# Patient Record
Sex: Female | Born: 1980
Health system: Southern US, Community
[De-identification: ages and names within clinical notes are randomized; demographics above are authoritative.]

## PROBLEM LIST (undated history)

## (undated) DIAGNOSIS — R112 Nausea with vomiting, unspecified: Secondary | ICD-10-CM

## (undated) DIAGNOSIS — N393 Stress incontinence (female) (male): Secondary | ICD-10-CM

## (undated) DIAGNOSIS — Z789 Other specified health status: Secondary | ICD-10-CM

## (undated) DIAGNOSIS — Z87442 Personal history of urinary calculi: Secondary | ICD-10-CM

## (undated) DIAGNOSIS — Z9889 Other specified postprocedural states: Secondary | ICD-10-CM

## (undated) DIAGNOSIS — N812 Incomplete uterovaginal prolapse: Secondary | ICD-10-CM

## (undated) DIAGNOSIS — K649 Unspecified hemorrhoids: Secondary | ICD-10-CM

## (undated) DIAGNOSIS — D352 Benign neoplasm of pituitary gland: Secondary | ICD-10-CM

## (undated) HISTORY — DX: Benign neoplasm of pituitary gland: D35.2

## (undated) HISTORY — PX: PLACEMENT OF BREAST IMPLANTS: SHX6334

## (undated) HISTORY — DX: Other specified health status: Z78.9

---

## 2005-11-04 ENCOUNTER — Emergency Department (HOSPITAL_COMMUNITY): Admission: EM | Admit: 2005-11-04 | Discharge: 2005-11-04 | Payer: Self-pay | Admitting: Emergency Medicine

## 2008-08-15 ENCOUNTER — Emergency Department (HOSPITAL_COMMUNITY): Admission: EM | Admit: 2008-08-15 | Discharge: 2008-08-15 | Payer: Self-pay | Admitting: Emergency Medicine

## 2008-08-15 IMAGING — CR DG CHEST 2V
2 series · 2 of 2 positions shown · non-contrast
Comparison: None

CLINICAL DATA: Cough and chest congestion.  Anterior right chest
pain.

CHEST - 2 VIEW

[view not recorded (1 of 2)]
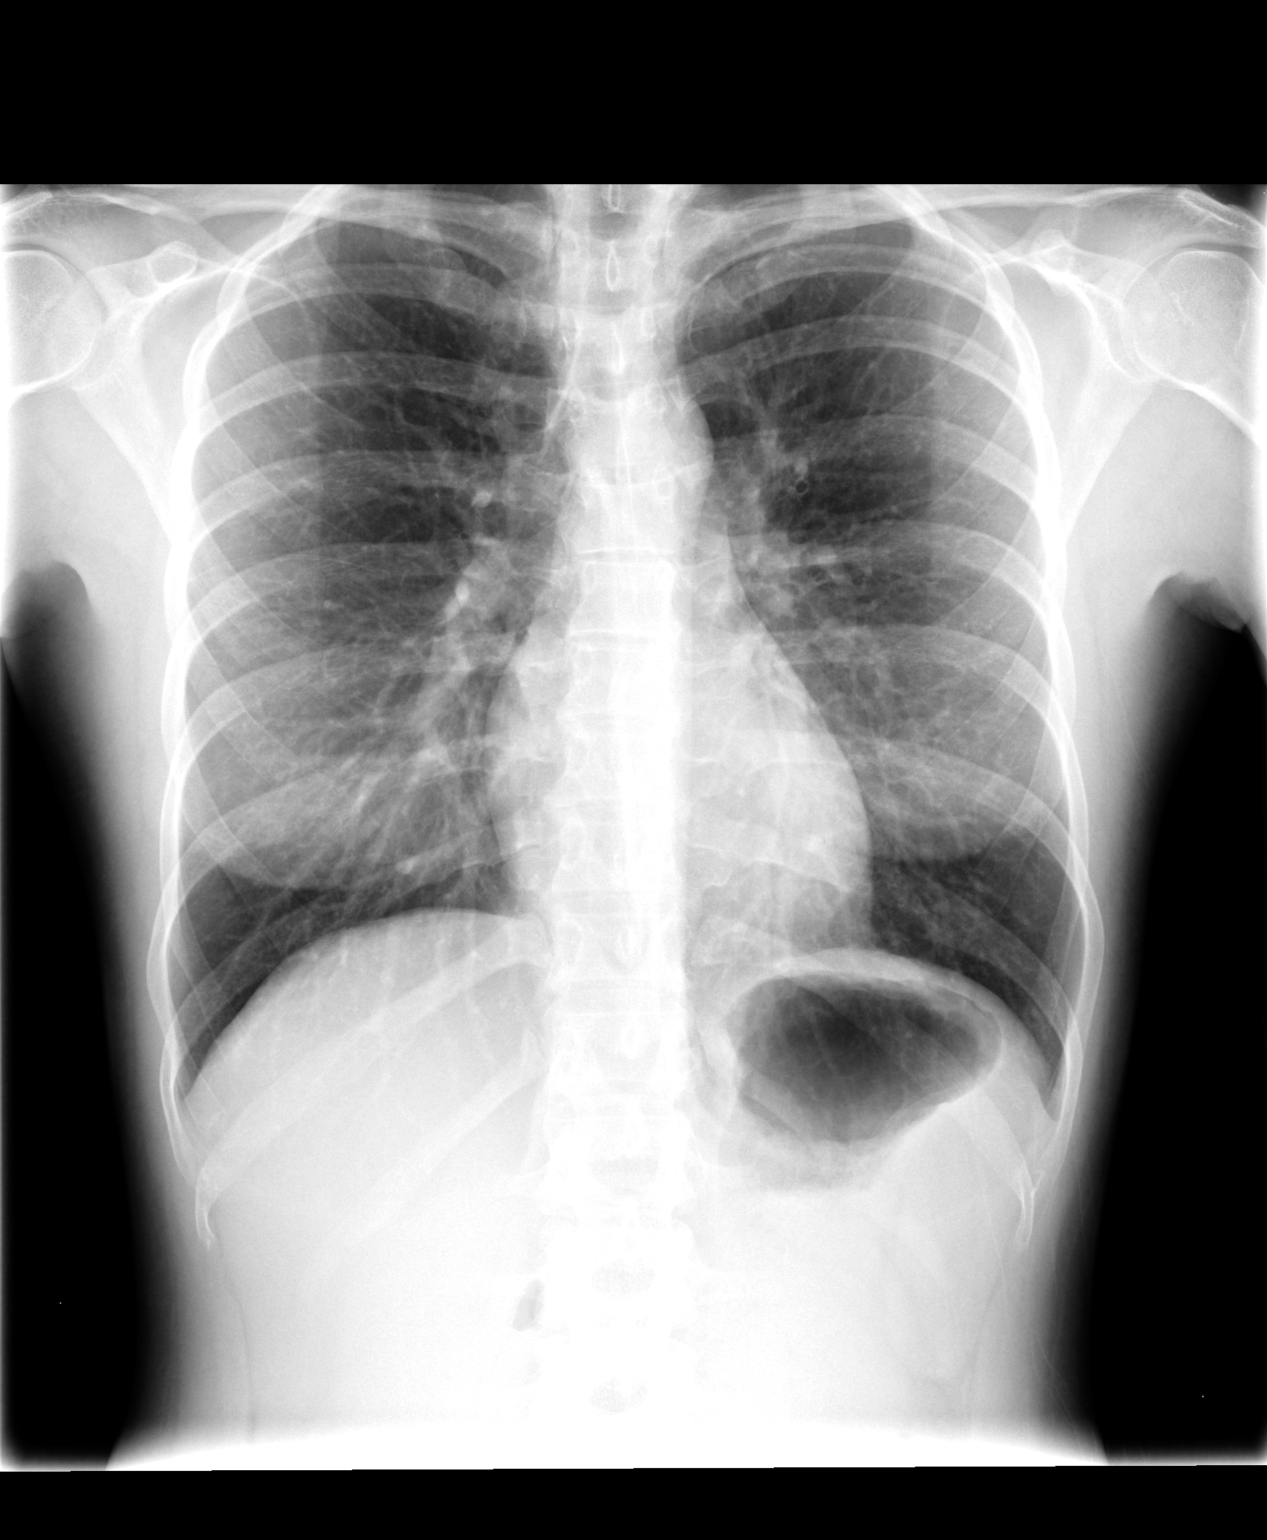

[view not recorded (2 of 2)]
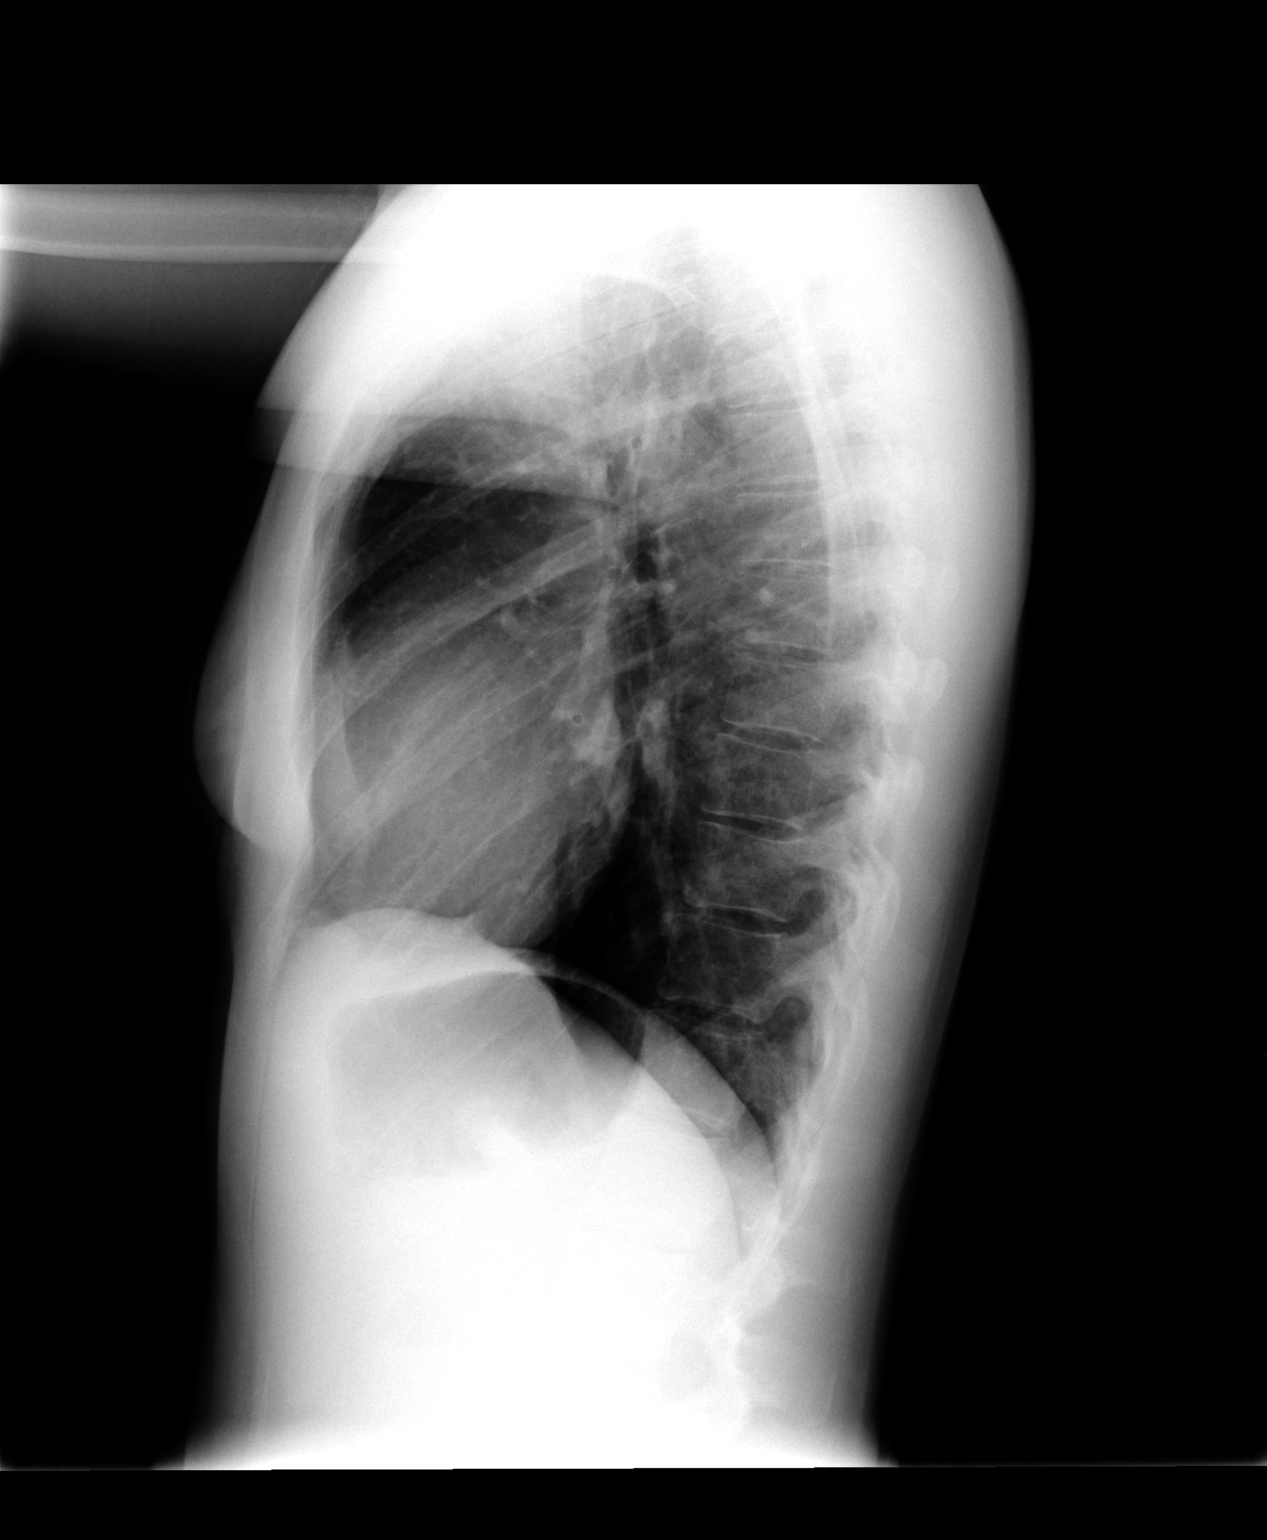

[2 of 2 positions shown; findings below may reference images not displayed]

FINDINGS: There is prominent peribronchial thickening consistent
with bronchitis.  The heart size and vascularity are normal.

There is a nondisplaced fracture of the anterior aspect of the
right sixth rib laterally.  No other bony abnormality.
IMPRESSION: 1.  Nondisplaced fracture of the anterior lateral aspect of the
right sixth rib.
2.  Extensive bronchitic changes.

## 2008-08-21 HISTORY — PX: AUGMENTATION MAMMAPLASTY: SUR837

## 2008-08-21 HISTORY — PX: BREAST ENHANCEMENT SURGERY: SHX7

## 2012-05-24 ENCOUNTER — Emergency Department (HOSPITAL_COMMUNITY)
Admission: EM | Admit: 2012-05-24 | Discharge: 2012-05-24 | Disposition: A | Discharge status: Home / Self Care | Payer: BC Managed Care – PPO | Attending: Emergency Medicine | Admitting: Emergency Medicine

## 2012-05-24 ENCOUNTER — Encounter (HOSPITAL_COMMUNITY): Payer: Self-pay | Admitting: Emergency Medicine

## 2012-05-24 DIAGNOSIS — M545 Low back pain, unspecified: Secondary | ICD-10-CM | POA: Insufficient documentation

## 2012-05-24 DIAGNOSIS — M549 Dorsalgia, unspecified: Secondary | ICD-10-CM
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
## 2012-05-24 NOTE — ED Provider Notes (Signed)
Medical screening examination/treatment/procedure(s) were performed by non-physician practitioner and as supervising physician I was immediately available for consultation/collaboration.  Flint Melter, MD 05/24/12 [phone]
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
## 2013-05-13 ENCOUNTER — Telehealth: Payer: Self-pay | Admitting: Family Medicine

## 2013-05-13 MED ORDER — VALACYCLOVIR HCL 1 G PO TABS: 1000.0000 mg | ORAL_TABLET | Freq: Three times a day (TID) | ORAL | Status: AC

## 2013-05-13 NOTE — Telephone Encounter (Signed)
Medication was sent to pharmacy. Patient was notified.  

## 2013-05-13 NOTE — Telephone Encounter (Signed)
Pt wants to know if she can get a refill on her Valtrex for the blister coming up on her finger? Please call into [facility]

## 2013-05-13 NOTE — Telephone Encounter (Signed)
NTC valtrex may be refilled times 3

## 2013-11-03 ENCOUNTER — Encounter: Payer: Self-pay | Admitting: Family Medicine

## 2013-11-03 ENCOUNTER — Ambulatory Visit (INDEPENDENT_AMBULATORY_CARE_PROVIDER_SITE_OTHER): Payer: BC Managed Care – PPO | Admitting: Family Medicine

## 2013-11-03 VITALS — BP 102/80 | Temp 98.9°F | Ht 61.0 in | Wt 116.4 lb

## 2013-11-03 DIAGNOSIS — L259 Unspecified contact dermatitis, unspecified cause: Secondary | ICD-10-CM

## 2013-11-03 MED ORDER — PREDNISONE 20 MG PO TABS: ORAL_TABLET | ORAL | Status: AC

## 2013-11-03 NOTE — Progress Notes (Signed)
   Subjective:    Patient ID: Briana Valdez, female    DOB: February 19, 1981, 33 y.o.   MRN: [id number]  Rash This is a new problem. The current episode started in the past 7 days. The problem has been gradually worsening since onset. The affected locations include the face, left ear and right ear. The rash is characterized by redness and itchiness. She was exposed to nothing. Past treatments include antihistamine. The treatment provided no relief.  Otalgia  There is pain in the right ear. This is a new problem. The current episode started today. The problem occurs constantly. The problem has been unchanged. There has been no fever. The pain is moderate. Associated symptoms include a rash. She has tried NSAIDs for the symptoms. The treatment provided mild relief.   PMH benign   Review of Systems  HENT: Positive for ear pain.   Skin: Positive for rash.       Objective:   Physical Exam Moderate contact dermatitis on the face underneath the cheeks around the ears no airway compromise lungs are clear hearts regular  Ears are normal there is no sign of ear infection.     Assessment & Plan:  Contact dermatitis is noted I recommend treatment with prednisone taper. If her reoccurrence of this occurs in the near future then referral to allergist.

## 2014-03-09 ENCOUNTER — Telehealth: Payer: Self-pay | Admitting: Family Medicine

## 2014-03-09 MED ORDER — FLUCONAZOLE 150 MG PO TABS: 150.0000 mg | ORAL_TABLET | Freq: Once | ORAL | Status: DC

## 2014-03-09 NOTE — Telephone Encounter (Signed)
[place] to [place] we will call [place] some  Diflucan x2 for her to a pharm [place] there?   [facility] Phone [phone]

## 2014-03-09 NOTE — Telephone Encounter (Signed)
Diflucan, 150 mg , 1 qwk times 2, #2 tablets , 1 refill

## 2014-03-09 NOTE — Telephone Encounter (Signed)
Patient notified

## 2015-06-07 ENCOUNTER — Telehealth: Payer: Self-pay | Admitting: Family Medicine

## 2015-06-07 MED ORDER — MALATHION 0.5 % EX LOTN: TOPICAL_LOTION | Freq: Once | CUTANEOUS | Status: DC

## 2015-06-07 NOTE — Telephone Encounter (Signed)
Pt needs that sent to [facility]

## 2015-06-07 NOTE — Telephone Encounter (Signed)
Topical Ovide, send in rx plz

## 2015-06-07 NOTE — Telephone Encounter (Signed)
Called patient and informed her per [place] was sent into pharmacy. Also advised patient to ask pharmacist for direct instructions on how to use medication. Patient verbalized understanding.

## 2015-06-07 NOTE — Telephone Encounter (Signed)
Pts son got lice over the weekend, she has tried Nix ultra to unable  To control it, has washed everything in the house, etc. She feels she  Needs to treat herself as well   [facility]

## 2016-08-21 DIAGNOSIS — D352 Benign neoplasm of pituitary gland: Secondary | ICD-10-CM

## 2016-08-21 HISTORY — DX: Benign neoplasm of pituitary gland: D35.2

## 2016-08-24 ENCOUNTER — Ambulatory Visit (INDEPENDENT_AMBULATORY_CARE_PROVIDER_SITE_OTHER): Payer: BLUE CROSS/BLUE SHIELD | Admitting: Family Medicine

## 2016-08-24 ENCOUNTER — Encounter: Payer: Self-pay | Admitting: Family Medicine

## 2016-08-24 VITALS — BP 100/70 | Temp 98.3°F | Ht 61.0 in | Wt 116.0 lb

## 2016-08-24 DIAGNOSIS — J019 Acute sinusitis, unspecified: Secondary | ICD-10-CM

## 2016-08-24 DIAGNOSIS — B349 Viral infection, unspecified: Secondary | ICD-10-CM

## 2016-08-24 DIAGNOSIS — B9689 Other specified bacterial agents as the cause of diseases classified elsewhere: Secondary | ICD-10-CM

## 2016-08-24 MED ORDER — AMOXICILLIN 500 MG PO TABS: 500.0000 mg | ORAL_TABLET | Freq: Three times a day (TID) | ORAL | 1 refills | Status: DC

## 2016-08-24 NOTE — Progress Notes (Signed)
   Subjective:    Patient ID: Briana Valdez, female    DOB: Mar 03, 1981, 36 y.o.   MRN: [id number]  Sinusitis  This is a new problem. The current episode started in the past 7 days. The problem is unchanged. There has been no fever. The pain is moderate. Associated symptoms include congestion, coughing, ear pain, headaches, sinus pressure, a sore throat and swollen glands. Pertinent negatives include no shortness of breath. Past treatments include spray decongestants and oral decongestants (ibuprofen, Tussin CF). The treatment provided no relief.   Patient states that she has no other concerns at this time.  PMH benign  Review of Systems  Constitutional: Negative for activity change and fever.  HENT: Positive for congestion, ear pain, rhinorrhea, sinus pressure and sore throat.   Eyes: Negative for discharge.  Respiratory: Positive for cough. Negative for shortness of breath and wheezing.   Cardiovascular: Negative for chest pain.  Neurological: Positive for headaches.       Objective:   Physical Exam  Constitutional: She appears well-developed.  HENT:  Head: Normocephalic.  Nose: Nose normal.  Mouth/Throat: Oropharynx is clear and moist. No oropharyngeal exudate.  Neck: Neck supple.  Cardiovascular: Normal rate and normal heart sounds.   No murmur heard. Pulmonary/Chest: Effort normal and breath sounds normal. She has no wheezes.  Lymphadenopathy:    She has no cervical adenopathy.  Skin: Skin is warm and dry.  Nursing note and vitals reviewed.  Minimal adenopathy is noted more on the right side in the left side of the anterior portion of the neck       Assessment & Plan:  Viral syndrome Secondary rhinosinusitis Antibiotic prescribed warning signs discussed follow-up if progressive troubles

## 2017-07-16 ENCOUNTER — Telehealth: Payer: Self-pay | Admitting: Family Medicine

## 2017-07-16 DIAGNOSIS — Z13220 Encounter for screening for lipoid disorders: Secondary | ICD-10-CM

## 2017-07-16 DIAGNOSIS — Z0000 Encounter for general adult medical examination without abnormal findings: Secondary | ICD-10-CM

## 2017-07-16 DIAGNOSIS — Z114 Encounter for screening for human immunodeficiency virus [HIV]: Secondary | ICD-10-CM

## 2017-07-16 NOTE — Telephone Encounter (Signed)
Patient has an appt for a physical on January 9th and would like to have labwork done in advance.  No hurry but would like to go ahead and get the order put in.

## 2017-07-16 NOTE — Telephone Encounter (Signed)
No bloodwork in epic

## 2017-07-16 NOTE — Telephone Encounter (Signed)
Lipid, liver, metabolic 7, CBC-I also recommend HIV antibody you please let the patient know that this is standard as a one-time test for screening per CDC guidelines

## 2017-07-16 NOTE — Telephone Encounter (Signed)
Discussed with pt. Pt is ok with HIV being orders. Pt notified where to do labs and to be fasting. Pt verbalized understanding. Orders put in.

## 2017-08-15 ENCOUNTER — Ambulatory Visit: Payer: BLUE CROSS/BLUE SHIELD | Admitting: Family Medicine

## 2017-08-29 ENCOUNTER — Encounter: Payer: BLUE CROSS/BLUE SHIELD | Admitting: Family Medicine

## 2017-12-12 ENCOUNTER — Encounter: Payer: Self-pay | Admitting: Family Medicine

## 2017-12-17 ENCOUNTER — Ambulatory Visit (INDEPENDENT_AMBULATORY_CARE_PROVIDER_SITE_OTHER): Payer: BLUE CROSS/BLUE SHIELD | Admitting: Family Medicine

## 2017-12-17 ENCOUNTER — Encounter: Payer: Self-pay | Admitting: Family Medicine

## 2017-12-17 VITALS — BP 118/82 | Ht 61.0 in | Wt 121.0 lb

## 2017-12-17 DIAGNOSIS — Z114 Encounter for screening for human immunodeficiency virus [HIV]: Secondary | ICD-10-CM | POA: Diagnosis not present

## 2017-12-17 DIAGNOSIS — Z13220 Encounter for screening for lipoid disorders: Secondary | ICD-10-CM | POA: Diagnosis not present

## 2017-12-17 DIAGNOSIS — Z0000 Encounter for general adult medical examination without abnormal findings: Secondary | ICD-10-CM

## 2017-12-17 NOTE — Progress Notes (Signed)
   Subjective:    Patient ID: Briana Valdez, female    DOB: Sep 04, 1980, 37 y.o.   MRN: [id number]  HPI The patient comes in today for a wellness visit.    A review of their health history was completed.  A review of medications was also completed.  Any needed refills; No  Eating habits: Good  Falls/  MVA accidents in past few months: No  Regular exercise: Not like she should,but stays busy.  Specialist pt sees on regular basis: No  Preventative health issues were discussed.   Additional concerns: No  She tries eat healthy Does not smoke or drink  Review of Systems  Constitutional: Negative for activity change, appetite change and fatigue.  HENT: Negative for congestion and rhinorrhea.   Eyes: Negative for discharge.  Respiratory: Negative for cough, chest tightness and wheezing.   Cardiovascular: Negative for chest pain.  Gastrointestinal: Negative for abdominal pain, blood in stool and vomiting.  Endocrine: Negative for polyphagia.  Genitourinary: Negative for difficulty urinating and frequency.  Musculoskeletal: Negative for neck pain.  Skin: Negative for color change.  Allergic/Immunologic: Negative for environmental allergies and food allergies.  Neurological: Negative for weakness and headaches.  Psychiatric/Behavioral: Negative for agitation and behavioral problems.       Objective:   Physical Exam  Constitutional: She is oriented to person, place, and time. She appears well-developed and well-nourished.  HENT:  Head: Normocephalic.  Right Ear: External ear normal.  Left Ear: External ear normal.  Eyes: Pupils are equal, round, and reactive to light.  Neck: Normal range of motion. No thyromegaly present.  Cardiovascular: Normal rate, regular rhythm, normal heart sounds and intact distal pulses.  No murmur heard. Pulmonary/Chest: Effort normal and breath sounds normal. No respiratory distress. She has no wheezes.  Abdominal: Soft. Bowel sounds are  normal. She exhibits no distension and no mass. There is no tenderness.  Musculoskeletal: Normal range of motion. She exhibits no edema or tenderness.  Lymphadenopathy:    She has no cervical adenopathy.  Neurological: She is alert and oriented to person, place, and time. She exhibits normal muscle tone.  Skin: Skin is warm and dry.  Psychiatric: She has a normal mood and affect. Her behavior is normal.   Patient does not smoke or drink Denies being depressed No falls or injury       Assessment & Plan:  Adult wellness-complete.wellness physical was conducted today. Importance of diet and exercise were discussed in detail.  In addition to this a discussion regarding safety was also covered. We also reviewed over immunizations and gave recommendations regarding current immunization needed for age.  In addition to this additional areas were also touched on including: Preventative health exams needed: Colonoscopy not indicated currently  Patient was advised yearly wellness exam Patient will do m female gynecologic wellness for her gynecologic issues on a regular basis with her gynecologist   basic lab work ordered

## 2017-12-18 LAB — CBC WITH DIFFERENTIAL/PLATELET
BASOS: 0 %
Basophils Absolute: 0 10*3/uL (ref 0.0–0.2)
EOS (ABSOLUTE): 0.1 10*3/uL (ref 0.0–0.4)
EOS: 1 %
HEMATOCRIT: 44.1 % (ref 34.0–46.6)
HEMOGLOBIN: 14.8 g/dL (ref 11.1–15.9)
IMMATURE GRANS (ABS): 0 10*3/uL (ref 0.0–0.1)
IMMATURE GRANULOCYTES: 1 %
LYMPHS: 24 %
Lymphocytes Absolute: 1.8 10*3/uL (ref 0.7–3.1)
MCH: 31.8 pg (ref 26.6–33.0)
MCHC: 33.6 g/dL (ref 31.5–35.7)
MCV: 95 fL (ref 79–97)
MONOCYTES: 10 %
Monocytes Absolute: 0.8 10*3/uL (ref 0.1–0.9)
NEUTROS PCT: 64 %
Neutrophils Absolute: 4.8 10*3/uL (ref 1.4–7.0)
PLATELETS: 267 10*3/uL (ref 150–379)
RBC: 4.65 x10E6/uL (ref 3.77–5.28)
RDW: 13 % (ref 12.3–15.4)
WBC: 7.4 10*3/uL (ref 3.4–10.8)

## 2017-12-18 LAB — LIPID PANEL
Chol/HDL Ratio: 2 ratio (ref 0.0–4.4)
Cholesterol, Total: 148 mg/dL (ref 100–199)
HDL: 75 mg/dL (ref >39)
LDL CALC: 64 mg/dL (ref 0–99)
Triglycerides: 43 mg/dL (ref 0–149)
VLDL CHOLESTEROL CAL: 9 mg/dL (ref 5–40)

## 2017-12-18 LAB — HEPATIC FUNCTION PANEL
ALBUMIN: 4.5 g/dL (ref 3.5–5.5)
ALT: 12 IU/L (ref 0–32)
AST: 17 IU/L (ref 0–40)
Alkaline Phosphatase: 43 IU/L (ref 39–117)
BILIRUBIN TOTAL: 0.7 mg/dL (ref 0.0–1.2)
Bilirubin, Direct: 0.2 mg/dL (ref 0.00–0.40)
TOTAL PROTEIN: 7.3 g/dL (ref 6.0–8.5)

## 2017-12-18 LAB — HIV ANTIBODY (ROUTINE TESTING W REFLEX): HIV Screen 4th Generation wRfx: NONREACTIVE

## 2017-12-18 LAB — BASIC METABOLIC PANEL
BUN/Creatinine Ratio: 19 (ref 9–23)
BUN: 12 mg/dL (ref 6–20)
CALCIUM: 9.4 mg/dL (ref 8.7–10.2)
CO2: 24 mmol/L (ref 20–29)
CREATININE: 0.64 mg/dL (ref 0.57–1.00)
Chloride: 102 mmol/L (ref 96–106)
GFR calc non Af Amer: 114 mL/min/{1.73_m2} (ref >59)
GFR, EST AFRICAN AMERICAN: 131 mL/min/{1.73_m2} (ref >59)
Glucose: 71 mg/dL (ref 65–99)
Potassium: 4.4 mmol/L (ref 3.5–5.2)
Sodium: 139 mmol/L (ref 134–144)

## 2018-01-23 ENCOUNTER — Telehealth: Payer: Self-pay | Admitting: Family Medicine

## 2018-01-23 ENCOUNTER — Telehealth: Payer: Self-pay | Admitting: *Deleted

## 2018-01-23 ENCOUNTER — Other Ambulatory Visit: Payer: Self-pay | Admitting: Family Medicine

## 2018-01-23 MED ORDER — MOMETASONE FUROATE 0.1 % EX CREA: TOPICAL_CREAM | CUTANEOUS | 1 refills | Status: DC

## 2018-01-23 NOTE — Telephone Encounter (Signed)
Pt husband called requesting prednisone. Husband stated that his wife has a patch of poison oak on her leg. Could we call in something for her also?

## 2018-01-23 NOTE — Telephone Encounter (Signed)
As for the wife-this patient-there are choices. 1 choices a topical steroid although it could take a while for this to clear things up the other option is prednisone taper But if she would need to be on this over a standard 9-day treatment-please talk with family find out what they would like to do then we can send this in

## 2018-01-23 NOTE — Telephone Encounter (Signed)
Left message to return call 

## 2018-01-23 NOTE — Telephone Encounter (Signed)
Elocon cream 0.1% apply twice daily, will typically have to use this for 7 to 10 days.  Apply thin amount.  30 g, 1 refill

## 2018-01-23 NOTE — Telephone Encounter (Signed)
Patient returned call and new message was taken - see other patient call 01/23/18

## 2018-01-23 NOTE — Telephone Encounter (Signed)
Med sent into pharmacy.

## 2018-01-23 NOTE — Telephone Encounter (Signed)
Patient returned call and would like the cream sent in for poison oak

## 2018-01-23 NOTE — Telephone Encounter (Signed)
Patient called requesting something to be called in for poison ivy, patient states it is only on her knees and ankles. Please advise   [place]

## 2018-01-24 ENCOUNTER — Encounter: Payer: Self-pay | Admitting: Family Medicine

## 2018-01-24 ENCOUNTER — Ambulatory Visit: Payer: BLUE CROSS/BLUE SHIELD | Admitting: Family Medicine

## 2018-01-24 VITALS — BP 124/82 | Temp 98.9°F | Ht 61.0 in | Wt 120.0 lb

## 2018-01-24 DIAGNOSIS — R21 Rash and other nonspecific skin eruption: Secondary | ICD-10-CM

## 2018-01-24 MED ORDER — MUPIROCIN 2 % EX OINT: 1.0000 "application " | TOPICAL_OINTMENT | Freq: Two times a day (BID) | CUTANEOUS | 0 refills | Status: DC

## 2018-01-24 MED ORDER — PREDNISONE 20 MG PO TABS: ORAL_TABLET | ORAL | 0 refills | Status: DC

## 2018-01-24 NOTE — Progress Notes (Signed)
   Subjective:    Patient ID: Briana Valdez, female    DOB: 09-11-1980, 37 y.o.   MRN: [id number]  Rash  This is a new problem. Episode onset: 4 -5 days ago. Location: legs. She was exposed to plant contact. Treatments tried: elocon cream.     Patient has no major history of poison oak or poison ivy allergy.  At outside a lot this past week.  Developed a rash on her right ankle.  Started weeping.  Developed a very large blister which was alarming to the patient.  No fever no chills no headache   Review of Systems  Skin: Positive for rash.       Objective:   Physical Exam  Alert vitals stable, NAD. Blood pressure good on repeat. HEENT normal. Lungs clear. Heart regular rate and rhythm. Impressive right anterior ankle patch with diffuse weeping and one very large blister which was prepped draped and incised and drained.      Assessment & Plan:  Impression impressive ruse dermatitis local measures discussed.  Prednisone taper.  Bactroban twice daily to ulcerated area.  Expect slow resolution rationale discussed.

## 2019-05-17 DIAGNOSIS — R350 Frequency of micturition: Secondary | ICD-10-CM | POA: Diagnosis not present

## 2019-05-17 DIAGNOSIS — B373 Candidiasis of vulva and vagina: Secondary | ICD-10-CM | POA: Diagnosis not present

## 2019-05-17 DIAGNOSIS — R300 Dysuria: Secondary | ICD-10-CM | POA: Diagnosis not present

## 2019-06-04 ENCOUNTER — Telehealth: Payer: Self-pay | Admitting: Family Medicine

## 2019-06-04 DIAGNOSIS — Z13220 Encounter for screening for lipoid disorders: Secondary | ICD-10-CM

## 2019-06-04 DIAGNOSIS — Z0000 Encounter for general adult medical examination without abnormal findings: Secondary | ICD-10-CM

## 2019-06-04 DIAGNOSIS — Z131 Encounter for screening for diabetes mellitus: Secondary | ICD-10-CM

## 2019-06-04 NOTE — Telephone Encounter (Signed)
Pt has physical here 06/16/2019 - needs labs ordered  Pt aware we will only call if there's an issue

## 2019-06-04 NOTE — Telephone Encounter (Signed)
Last labs completed on 12/17/17 HIV,CBC, BMET,HEPATIC and LIPID. Please advise. Thank you

## 2019-06-04 NOTE — Telephone Encounter (Signed)
Her lab work last year looked very good I would recommend just a lipid and glucose

## 2019-06-04 NOTE — Telephone Encounter (Signed)
Labs placed and mailed to patient  

## 2019-06-09 ENCOUNTER — Encounter: Payer: BLUE CROSS/BLUE SHIELD | Admitting: Family Medicine

## 2019-06-13 DIAGNOSIS — L659 Nonscarring hair loss, unspecified: Secondary | ICD-10-CM | POA: Diagnosis not present

## 2019-06-13 DIAGNOSIS — Z0000 Encounter for general adult medical examination without abnormal findings: Secondary | ICD-10-CM | POA: Diagnosis not present

## 2019-06-13 DIAGNOSIS — Z131 Encounter for screening for diabetes mellitus: Secondary | ICD-10-CM | POA: Diagnosis not present

## 2019-06-13 DIAGNOSIS — Z13220 Encounter for screening for lipoid disorders: Secondary | ICD-10-CM | POA: Diagnosis not present

## 2019-06-14 LAB — LIPID PANEL
Chol/HDL Ratio: 2.2 ratio (ref 0.0–4.4)
Cholesterol, Total: 156 mg/dL (ref 100–199)
HDL: 72 mg/dL (ref >39)
LDL Chol Calc (NIH): 74 mg/dL (ref 0–99)
Triglycerides: 44 mg/dL (ref 0–149)
VLDL Cholesterol Cal: 10 mg/dL (ref 5–40)

## 2019-06-14 LAB — GLUCOSE, RANDOM: Glucose: 78 mg/dL (ref 65–99)

## 2019-06-16 ENCOUNTER — Encounter: Payer: Self-pay | Admitting: Family Medicine

## 2019-06-16 ENCOUNTER — Other Ambulatory Visit: Payer: Self-pay

## 2019-06-16 ENCOUNTER — Ambulatory Visit (INDEPENDENT_AMBULATORY_CARE_PROVIDER_SITE_OTHER): Payer: BC Managed Care – PPO | Admitting: Family Medicine

## 2019-06-16 VITALS — BP 116/74 | Temp 98.4°F | Ht 61.35 in | Wt 112.8 lb

## 2019-06-16 DIAGNOSIS — Z0000 Encounter for general adult medical examination without abnormal findings: Secondary | ICD-10-CM

## 2019-06-16 NOTE — Progress Notes (Signed)
Subjective:    Patient ID: Briana Valdez, female    DOB: Jan 31, 1981, 38 y.o.   MRN: [id number]  HPI The patient comes in today for a wellness visit. This patient does try to keep her self healthy by eating on a regular basis stays physically active.  Denies any setbacks recently Under a lot of stress between taking care of her family as well as Covid restrictions and also she is a hairdresser She does get some soreness in the hands but nothing severe no true arthralgias   A review of their health history was completed.  A review of medications was also completed.  Any needed refills; not taking any meds  Eating habits: health conscious  Falls/  MVA accidents in past few months: none  Regular exercise: not really, treadmill every once and awhile  Specialist pt sees on regular basis: none, gyn for pap.   Preventative health issues were discussed.   Additional concerns: none  Pt declines flu vaccine.     Review of Systems  Constitutional: Negative for activity change, appetite change and fatigue.  HENT: Negative for congestion and rhinorrhea.   Eyes: Negative for discharge.  Respiratory: Negative for cough, chest tightness and wheezing.   Cardiovascular: Negative for chest pain.  Gastrointestinal: Negative for abdominal pain, blood in stool and vomiting.  Endocrine: Negative for polyphagia.  Genitourinary: Negative for difficulty urinating and frequency.  Musculoskeletal: Negative for neck pain.  Skin: Negative for color change.  Allergic/Immunologic: Negative for environmental allergies and food allergies.  Neurological: Negative for weakness and headaches.  Psychiatric/Behavioral: Negative for agitation and behavioral problems.       Objective:   Physical Exam Constitutional:      Appearance: She is well-developed.  HENT:     Head: Normocephalic.     Right Ear: External ear normal.     Left Ear: External ear normal.  Eyes:     Pupils: Pupils are equal,  round, and reactive to light.  Neck:     Musculoskeletal: Normal range of motion.     Thyroid: No thyromegaly.  Cardiovascular:     Rate and Rhythm: Normal rate and regular rhythm.     Heart sounds: Normal heart sounds. No murmur.  Pulmonary:     Effort: Pulmonary effort is normal. No respiratory distress.     Breath sounds: Normal breath sounds. No wheezing.  Abdominal:     General: Bowel sounds are normal. There is no distension.     Palpations: Abdomen is soft. There is no mass.     Tenderness: There is no abdominal tenderness.  Musculoskeletal: Normal range of motion.        General: No tenderness.  Lymphadenopathy:     Cervical: No cervical adenopathy.  Skin:    General: Skin is warm and dry.  Neurological:     Mental Status: She is alert and oriented to person, place, and time.     Motor: No abnormal muscle tone.  Psychiatric:        Behavior: Behavior normal.           Assessment & Plan:  Safety dietary measures recommended Flu shot recommended but patient defers Patient is to follow-up with gynecology on a regular basis Also patient should get mammograms on a regular basis.  She states she will get this through gynecology  Adult wellness-complete.wellness physical was conducted today. Importance of diet and exercise were discussed in detail.  In addition to this a discussion regarding safety was also  covered. We also reviewed over immunizations and gave recommendations regarding current immunization needed for age.  In addition to this additional areas were also touched on including: Preventative health exams needed:  Colonoscopy not indicated currently  Patient was advised yearly wellness exam

## 2019-06-17 ENCOUNTER — Telehealth: Payer: Self-pay | Admitting: *Deleted

## 2019-06-17 NOTE — Telephone Encounter (Signed)
Called [facility] and had test to labs ths and free t4 dx- alopecia L65.9 per dr Nicki Reaper. Lab will pull sample and let us know if they can run or not. Await fax from lab

## 2019-06-24 LAB — SPECIMEN STATUS REPORT

## 2019-06-28 LAB — TSH: TSH: 0.573 u[IU]/mL (ref 0.450–4.500)

## 2019-06-28 LAB — SPECIMEN STATUS REPORT

## 2019-06-28 LAB — T4, FREE: Free T4: 1.19 ng/dL (ref 0.82–1.77)

## 2019-07-01 ENCOUNTER — Ambulatory Visit (INDEPENDENT_AMBULATORY_CARE_PROVIDER_SITE_OTHER): Payer: BC Managed Care – PPO | Admitting: Family Medicine

## 2019-07-01 ENCOUNTER — Other Ambulatory Visit: Payer: Self-pay

## 2019-07-01 DIAGNOSIS — J019 Acute sinusitis, unspecified: Secondary | ICD-10-CM | POA: Diagnosis not present

## 2019-07-01 MED ORDER — AMOXICILLIN 500 MG PO TABS: 500.0000 mg | ORAL_TABLET | Freq: Three times a day (TID) | ORAL | 0 refills | Status: DC

## 2019-07-01 NOTE — Progress Notes (Signed)
   Subjective:    Patient ID: Briana Valdez, female    DOB: [birth year], 38 y.o.   MRN: [id number]  Sinusitis This is a new problem. The current episode started 1 to 4 weeks ago. Associated symptoms include congestion and coughing. Pertinent negatives include no ear pain or shortness of breath. (Sinus drainage, drainage down throat at night, spent a lot of time outside on Saturday, on Sunday pt has more drainage with ear pain. Taking Sudafed and that is kind of helping. Sinus pressure has eased up some. No fever no cough. ) Treatments tried: Sudafed. The treatment provided mild relief.  Patient has had about a week of illness with head congestion drainage is worse over the past few days no fever chills sinus pressure pain discomfort teeth are aching otherwise feels fine  In addition to this some allergy symptoms over the past few weeks using sinus decongestant tablets but no antihistamines Virtual Visit via Video Note  I connected with Briana Valdez on 07/01/19 at 11:00 AM EST by a video enabled telemedicine application and verified that I am speaking with the correct person using two identifiers.  Location: Patient: home Provider: office   I discussed the limitations of evaluation and management by telemedicine and the availability of in person appointments. The patient expressed understanding and agreed to proceed.  History of Present Illness:    Observations/Objective:   Assessment and Plan:   Follow Up Instructions:    I discussed the assessment and treatment plan with the patient. The patient was provided an opportunity to ask questions and all were answered. The patient agreed with the plan and demonstrated an understanding of the instructions.   The patient was advised to call back or seek an in-person evaluation if the symptoms worsen or if the condition fails to improve as anticipated.  I provided 15 minutes of non-face-to-face time during this encounter.   Briana Males, LPN    Review of Systems  Constitutional: Negative for activity change and fever.  HENT: Positive for congestion and rhinorrhea. Negative for ear pain.   Eyes: Negative for discharge.  Respiratory: Positive for cough. Negative for shortness of breath and wheezing.   Cardiovascular: Negative for chest pain.       Objective:   Physical Exam Patient had virtual visit Appears to be in no distress Atraumatic Neuro able to relate and oriented No apparent resp distress Color normal        Assessment & Plan:  Based on his symptomatology I would recommend going ahead with antibiotics warning signs were discussed follow-up if progressive troubles or worse use allergy medicine decongestant medicine

## 2020-01-14 DIAGNOSIS — Z01419 Encounter for gynecological examination (general) (routine) without abnormal findings: Secondary | ICD-10-CM | POA: Diagnosis not present

## 2020-01-14 DIAGNOSIS — Z1231 Encounter for screening mammogram for malignant neoplasm of breast: Secondary | ICD-10-CM | POA: Diagnosis not present

## 2020-01-14 DIAGNOSIS — Z124 Encounter for screening for malignant neoplasm of cervix: Secondary | ICD-10-CM | POA: Diagnosis not present

## 2020-03-15 DIAGNOSIS — Z3202 Encounter for pregnancy test, result negative: Secondary | ICD-10-CM | POA: Diagnosis not present

## 2020-03-15 DIAGNOSIS — Z6821 Body mass index (BMI) 21.0-21.9, adult: Secondary | ICD-10-CM | POA: Diagnosis not present

## 2020-03-15 DIAGNOSIS — Z30433 Encounter for removal and reinsertion of intrauterine contraceptive device: Secondary | ICD-10-CM | POA: Diagnosis not present

## 2020-04-07 ENCOUNTER — Telehealth: Payer: Self-pay | Admitting: Family Medicine

## 2020-04-07 NOTE — Telephone Encounter (Signed)
Pt contacted and verbalized understanding.  

## 2020-04-07 NOTE — Telephone Encounter (Signed)
Pt states she has a Hemorid since yesterday. She has tried preporation H, ice packs, cooling pads and sitz bath. She states it is not as bad as yesterday but would like to know what else she can do.   [place].

## 2020-04-07 NOTE — Telephone Encounter (Signed)
So the approach for something like this is continue the Preparation H, use stool softeners to keep bowel movements soft, warm compresses as needed, warm sitz bath's as needed, these type of things can take anywhere from a few days to up to a couple weeks to get better Occasionally can get bad enough to refer to a general surgeon

## 2020-04-11 ENCOUNTER — Other Ambulatory Visit: Payer: Self-pay

## 2020-04-11 ENCOUNTER — Ambulatory Visit
Admission: EM | Admit: 2020-04-11 | Discharge: 2020-04-11 | Disposition: A | Discharge status: Home / Self Care | Payer: BC Managed Care – PPO | Attending: Family Medicine | Admitting: Family Medicine

## 2020-04-11 DIAGNOSIS — N3000 Acute cystitis without hematuria: Secondary | ICD-10-CM

## 2020-04-11 DIAGNOSIS — R3915 Urgency of urination: Secondary | ICD-10-CM | POA: Diagnosis not present

## 2020-04-11 DIAGNOSIS — R350 Frequency of micturition: Secondary | ICD-10-CM

## 2020-04-11 DIAGNOSIS — K648 Other hemorrhoids: Secondary | ICD-10-CM

## 2020-04-11 LAB — POCT URINALYSIS DIP (MANUAL ENTRY)
Bilirubin, UA: NEGATIVE
Glucose, UA: NEGATIVE mg/dL
Ketones, POC UA: NEGATIVE mg/dL
Nitrite, UA: POSITIVE — AB
Protein Ur, POC: 30 mg/dL — AB
Spec Grav, UA: 1.02 (ref 1.010–1.025)
Urobilinogen, UA: 0.2 E.U./dL
pH, UA: 7 (ref 5.0–8.0)

## 2020-04-11 MED ORDER — DIBUCAINE (PERIANAL) 1 % EX OINT: 1.0000 "application " | TOPICAL_OINTMENT | CUTANEOUS | 0 refills | Status: DC | PRN

## 2020-04-11 MED ORDER — NITROFURANTOIN MONOHYD MACRO 100 MG PO CAPS: 100.0000 mg | ORAL_CAPSULE | Freq: Two times a day (BID) | ORAL | 0 refills | Status: DC

## 2020-04-11 NOTE — Discharge Instructions (Addendum)
You may have a urinary tract infection.   We are going to culture your urine and will call you as soon as we have the results.   Drink plenty of water, 8-10 glasses per day.   I have sent in [place] for you to take twice a day for 5 days  I have also sent in dibucaine ointment to use for hemorrhoids  Continue ice, sitz baths, witch hazel, may add hydrocortisone cream for inflammation  You may take AZO over the counter for painful urination.  Follow up with your primary care provider as needed.   Go to the Emergency Department if you experience severe pain, shortness of breath, high fever, or other concerns.

## 2020-04-11 NOTE — ED Provider Notes (Signed)
[facility]   CC: UTI  SUBJECTIVE:  Briana Valdez is a 39 y.o. female who complains of urinary frequency, urgency and dysuria for the past 3 days. Patient denies a precipitating event, recent sexual encounter, excessive caffeine intake. Localizes the pain to the lower abdomen. Pain is intermittent and describes it as burning. Has tried OTC medications without relief. Symptoms are made worse with urination. Admits to similar symptoms in the past. Denies fever, chills, nausea, vomiting, flank pain, abnormal vaginal discharge or bleeding, hematuria.    Also reports that she has been struggling with hemorrhoids since her last child was born x 13 years ago. Reports that she has them now, worse than ever. Has been using tucks pads, preparation H, sitz baths with little relief. Pain is made worse with sitting.   LMP: No LMP recorded. (Menstrual status: IUD).  ROS: As in HPI.  All other pertinent ROS negative.     History reviewed. No pertinent past medical history. History reviewed. No pertinent surgical history. No Known Allergies No current facility-administered medications on file prior to encounter.   Current Outpatient Medications on File Prior to Encounter  Medication Sig Dispense Refill   amoxicillin (AMOXIL) 500 MG tablet Take 1 tablet (500 mg total) by mouth 3 (three) times daily. 30 tablet 0   loratadine (CLARITIN) 10 MG tablet Take 10 mg by mouth daily.     ranitidine (ZANTAC) 150 MG tablet Take 150 mg by mouth 2 (two) times daily.     Social History   Socioeconomic History   Marital status: Married    Spouse name: Not on file   Number of children: Not on file   Years of education: Not on file   Highest education level: Not on file  Occupational History   Not on file  Tobacco Use   Smoking status: Never Smoker   Smokeless tobacco: Never Used  Substance and Sexual Activity   Alcohol use: No   Drug use: No   Sexual activity: Yes    Birth  control/protection: Implant  Other Topics Concern   Not on file  Social History Narrative   Not on file   Social Determinants of Health   Financial Resource Strain:    Difficulty of Paying Living Expenses: Not on file  Food Insecurity:    Worried About [place] in the Last Year: Not on file   Ran Out of Food in the Last Year: Not on file  Transportation Needs:    Lack of Transportation (Medical): Not on file   Lack of Transportation (Non-Medical): Not on file  Physical Activity:    Days of Exercise per Week: Not on file   Minutes of Exercise per Session: Not on file  Stress:    Feeling of Stress : Not on file  Social Connections:    Frequency of Communication with Friends and Family: Not on file   Frequency of Social Gatherings with Friends and Family: Not on file   Attends Religious Services: Not on file   Active Member of Clubs or Organizations: Not on file   Attends Archivist Meetings: Not on file   Marital Status: Not on file  Intimate Partner Violence:    Fear of Current or Ex-Partner: Not on file   Emotionally Abused: Not on file   Physically Abused: Not on file   Sexually Abused: Not on file   Family History  Problem Relation Age of Onset   Cancer Mother  OBJECTIVE:  Vitals:   04/11/20 0850  BP: 119/81  Pulse: (!) 108  Resp: 20  Temp: 98.6 F (37 C)  SpO2: 97%   General appearance: AOx3 in no acute distress HEENT: NCAT. Oropharynx clear.  Lungs: clear to auscultation bilaterally without adventitious breath sounds Heart: regular rate and rhythm. Radial pulses 2+ symmetrical bilaterally Abdomen: soft; non-distended; suprapubic tenderness; bowel sounds present; no guarding or rebound tenderness; declined hemorrhoid exam Back: no CVA tenderness Extremities: no edema; symmetrical with no gross deformities Skin: warm and dry Neurologic: Ambulates from chair to exam table without difficulty Psychological: alert  and cooperative; normal mood and affect  Labs Reviewed  POCT URINALYSIS DIP (MANUAL ENTRY) - Abnormal; Notable for the following components:      Result Value   Clarity, UA hazy (*)    Blood, UA moderate (*)    Protein Ur, POC =30 (*)    Nitrite, UA Positive (*)    Leukocytes, UA Large (3+) (*)    All other components within normal limits  URINE CULTURE    ASSESSMENT & PLAN:  1. Acute cystitis without hematuria   2. Other hemorrhoids   3. Urinary urgency   4. Urinary frequency     Meds ordered this encounter  Medications   nitrofurantoin, macrocrystal-monohydrate, (MACROBID) 100 MG capsule    Sig: Take 1 capsule (100 mg total) by mouth 2 (two) times daily.    Dispense:  10 capsule    Refill:  0    Order Specific Question:   Supervising Provider    Answer:   Chase Picket [[id number]]   dibucaine (NUPERCAINAL) 1 % OINT    Sig: Place 1 application rectally as needed for hemorrhoids.    Dispense:  28 g    Refill:  0    Order Specific Question:   Supervising Provider    Answer:   Chase Picket [[id number]]    Prescribed Macrobid Prescribed Dibucaine ointment Urine culture sent  We will call you with abnormal results that need further treatment Push fluids and get plenty of rest Take antibiotic as directed and to completion Take pyridium as prescribed and as needed for symptomatic relief Follow up with PCP if symptoms persists Return here or go to ER if you have any new or worsening symptoms such as fever, worsening abdominal pain, nausea/vomiting, flank pain  Outlined signs and symptoms indicating need for more acute intervention Patient verbalized understanding After Visit Summary given     Faustino Congress, NP 04/13/20 [phone]

## 2020-04-11 NOTE — ED Triage Notes (Signed)
Pt presents with dysuria for past couple days, pt also has hemorrhoids with no relief with over the counter medicatios

## 2020-04-12 ENCOUNTER — Telehealth: Payer: Self-pay | Admitting: Family Medicine

## 2020-04-12 ENCOUNTER — Encounter: Payer: Self-pay | Admitting: Internal Medicine

## 2020-04-12 DIAGNOSIS — Z1329 Encounter for screening for other suspected endocrine disorder: Secondary | ICD-10-CM

## 2020-04-12 DIAGNOSIS — Z131 Encounter for screening for diabetes mellitus: Secondary | ICD-10-CM

## 2020-04-12 DIAGNOSIS — Z79899 Other long term (current) drug therapy: Secondary | ICD-10-CM

## 2020-04-12 DIAGNOSIS — Z13220 Encounter for screening for lipoid disorders: Secondary | ICD-10-CM

## 2020-04-12 DIAGNOSIS — Z0000 Encounter for general adult medical examination without abnormal findings: Secondary | ICD-10-CM

## 2020-04-12 NOTE — Telephone Encounter (Signed)
Last labs 06/13/19: t4, tsh, glucose and lipid

## 2020-04-12 NOTE — Telephone Encounter (Signed)
Pt has cpe 10/27 and would like lab work ordered.

## 2020-04-12 NOTE — Telephone Encounter (Signed)
Lipid, liver, metabolic 7  Please find out from patient if there is any other concerning issues other than just doing a "wellness No need to do CBC or TSH if underlying health doing well Let me know if any other concerns

## 2020-04-12 NOTE — Telephone Encounter (Signed)
Patient notified labs ordered, not having any concerns but would like thyroid checked.

## 2020-04-13 LAB — URINE CULTURE: Culture: 60000 — AB

## 2020-04-26 ENCOUNTER — Encounter: Payer: Self-pay | Admitting: Family Medicine

## 2020-04-27 NOTE — Telephone Encounter (Signed)
Nurses Please put this message into the patient's (Briana Valdez) chart under a phone message so that I can properly document on the patient's chart and not in the mother's chart

## 2020-04-27 NOTE — Telephone Encounter (Signed)
Copied in pt's chart and sent to dr scott

## 2020-05-18 ENCOUNTER — Encounter: Payer: Self-pay | Admitting: Family Medicine

## 2020-06-04 ENCOUNTER — Ambulatory Visit: Payer: BLUE CROSS/BLUE SHIELD | Admitting: Gastroenterology

## 2020-06-09 DIAGNOSIS — Z13220 Encounter for screening for lipoid disorders: Secondary | ICD-10-CM | POA: Diagnosis not present

## 2020-06-09 DIAGNOSIS — Z79899 Other long term (current) drug therapy: Secondary | ICD-10-CM | POA: Diagnosis not present

## 2020-06-09 DIAGNOSIS — Z0000 Encounter for general adult medical examination without abnormal findings: Secondary | ICD-10-CM | POA: Diagnosis not present

## 2020-06-09 DIAGNOSIS — Z1329 Encounter for screening for other suspected endocrine disorder: Secondary | ICD-10-CM | POA: Diagnosis not present

## 2020-06-10 LAB — HEPATIC FUNCTION PANEL
ALT: 13 IU/L (ref 0–32)
AST: 19 IU/L (ref 0–40)
Albumin: 4.4 g/dL (ref 3.8–4.8)
Alkaline Phosphatase: 37 IU/L — ABNORMAL LOW (ref 44–121)
Bilirubin Total: 0.4 mg/dL (ref 0.0–1.2)
Bilirubin, Direct: 0.15 mg/dL (ref 0.00–0.40)
Total Protein: 7.1 g/dL (ref 6.0–8.5)

## 2020-06-10 LAB — BASIC METABOLIC PANEL
BUN/Creatinine Ratio: 23 (ref 9–23)
BUN: 15 mg/dL (ref 6–24)
CO2: 24 mmol/L (ref 20–29)
Calcium: 9.3 mg/dL (ref 8.7–10.2)
Chloride: 101 mmol/L (ref 96–106)
Creatinine, Ser: 0.64 mg/dL (ref 0.57–1.00)
GFR calc Af Amer: 129 mL/min/{1.73_m2} (ref >59)
GFR calc non Af Amer: 112 mL/min/{1.73_m2} (ref >59)
Glucose: 74 mg/dL (ref 65–99)
Potassium: 4.1 mmol/L (ref 3.5–5.2)
Sodium: 138 mmol/L (ref 134–144)

## 2020-06-10 LAB — LIPID PANEL
Chol/HDL Ratio: 2.3 ratio (ref 0.0–4.4)
Cholesterol, Total: 156 mg/dL (ref 100–199)
HDL: 69 mg/dL (ref >39)
LDL Chol Calc (NIH): 77 mg/dL (ref 0–99)
Triglycerides: 44 mg/dL (ref 0–149)
VLDL Cholesterol Cal: 10 mg/dL (ref 5–40)

## 2020-06-10 LAB — TSH: TSH: 0.527 u[IU]/mL (ref 0.450–4.500)

## 2020-06-13 ENCOUNTER — Encounter: Payer: Self-pay | Admitting: Family Medicine

## 2020-06-16 ENCOUNTER — Encounter: Payer: BC Managed Care – PPO | Admitting: Family Medicine

## 2020-06-24 ENCOUNTER — Encounter: Payer: Self-pay | Admitting: Family Medicine

## 2020-07-28 ENCOUNTER — Other Ambulatory Visit: Payer: Self-pay

## 2020-07-28 ENCOUNTER — Telehealth: Payer: Self-pay | Admitting: *Deleted

## 2020-07-28 ENCOUNTER — Ambulatory Visit (INDEPENDENT_AMBULATORY_CARE_PROVIDER_SITE_OTHER): Payer: BC Managed Care – PPO | Admitting: Gastroenterology

## 2020-07-28 ENCOUNTER — Encounter: Payer: Self-pay | Admitting: Gastroenterology

## 2020-07-28 DIAGNOSIS — K625 Hemorrhage of anus and rectum: Secondary | ICD-10-CM | POA: Diagnosis not present

## 2020-07-28 NOTE — Patient Instructions (Signed)
We are arranging a colonoscopy in the near future.  It sounds like you had a thrombosed hemorrhoid acutely, and likely have existing small internal hemorrhoids that can bleed intermittently. Continue stool softener as you are doing, avoid straining, and limiting toilet time to 2-3 minutes.  Further recommendations to follow!   It was a pleasure to see you today. I want to create trusting relationships with patients to provide genuine, compassionate, and quality care. I value your feedback. If you receive a survey regarding your visit,  I greatly appreciate you taking time to fill this out.   Annitta Needs, PhD, ANP-BC [facility]

## 2020-07-28 NOTE — Progress Notes (Signed)
Cc'ed to pcp °

## 2020-07-28 NOTE — Telephone Encounter (Signed)
The following solutions for the service date entered do not require Pre-Authorization by [employer]. Please contact the health plan using the number on the back of the member's ID card to determine if a Pre-Authorization is required.

## 2020-07-28 NOTE — Progress Notes (Signed)
Primary Valdez Physician:  Briana Drown, MD  Referring Physician: [facility] Primary Gastroenterologist:  Dr. Abbey Briana Valdez  Chief Complaint  Patient presents with  . Hemorrhoids    never had TCS prior, no bleeding now. Just painful to sit/stand. Doing much better now. Has had 3 childbirths. This previous hemorrhoid was black/blue.    HPI:   Briana Briana Valdez is a delightful 39 y.o. female presenting today at the request of Briana Briana Valdez due to hemorrhoids.  Notes several year history of very low-volume painless hematochezia that is sporadic. Has had intermittent issues with external type hemorrhoids that resolved on own. However in [place] had acute episode of significant pain, swelling, and states she looked at rectum and saw "black" areas. Described as hard. Used ice packs, heat, sitz baths. Used a cream that helped. Took 3 weeks to resolve. 1 week significantly painful but then improved over next several weeks. No straining, limits toilet time, no constipation. Taking a stool softener now. 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   Past Medical History:  Diagnosis Date  . Medical history non-contributory     Past Surgical History:  Procedure Laterality Date  . PLACEMENT OF BREAST IMPLANTS      Current Outpatient Medications  Medication Sig Dispense Refill  . fluticasone (FLONASE) 50 MCG/ACT nasal spray Place 1 spray into both nostrils daily.     No current facility-administered medications for this visit.    Allergies as of 07/28/2020  . (No Known Allergies)    Family History  Problem Relation Age of Onset  . Cancer Mother   . Colon cancer Paternal Grandmother   . Colon polyps Neg Hx     Social History   Socioeconomic History  . Marital status: Married    Spouse name: Not on file  . Number of children: Not on file  . Years of education: Not on file  . Highest education level: Not on file  Occupational History     Comment: Briana Valdez  Tobacco Use  . Smoking status: Never Smoker  . Smokeless tobacco: Never Used  Substance and Sexual Activity  . Alcohol use: No  . Drug use: No  . Sexual activity: Yes    Birth control/protection: Implant  Other Topics Concern  . Not on file  Social History Narrative  . Not on file   Social Determinants of Health   Financial Resource Strain:   . Difficulty of Paying Living Expenses: Not on file  Food Insecurity:   . Worried About [employer] in the Last Year: Not on file  . Ran Out of Food in the Last Year: Not on file  Transportation Briana Valdez:   . Lack of Transportation (Medical): Not on file  . Lack of Transportation (Non-Medical): Not on file  Physical Activity:   . Days of Exercise per Week: Not on file  . Minutes of Exercise per Session: Not on file  Stress:   . Feeling of Stress : Not on file  Social Connections:   . Frequency of Communication with Friends and Family: Not on file  . Frequency of Social Gatherings with Friends and Family: Not on file  . Attends Religious Services: Not on file  . Active Member of Clubs or Organizations: Not on file  . Attends Archivist Meetings: Not on file  . Marital Status: Not on file  Intimate Partner Violence:   . Fear of Current or Ex-Partner: Not on file  .  Emotionally Abused: Not on file  . Physically Abused: Not on file  . Sexually Abused: Not on file    Review of Systems: Gen: Denies any fever, chills, fatigue, weight loss, lack of appetite.  CV: Denies chest pain, heart palpitations, peripheral edema, syncope.  Resp: Denies shortness of breath at rest or with exertion. Denies wheezing or cough.  GI: see HPI GU : Denies urinary burning, urinary frequency, urinary hesitancy MS: Denies joint pain, muscle weakness, cramps, or limitation of movement.  Derm: Denies rash, itching, dry skin Psych: Denies depression, anxiety, memory loss, and confusion Heme: see HPI  Physical Exam: BP 115/70    Pulse 78   Temp (!) 97.3 F (36.3 C)   Ht 5\' 1"  (1.549 m)   Wt 119 lb 12.8 oz (54.3 kg)   BMI 22.64 kg/m  General:   Alert and oriented. Pleasant and cooperative. Well-nourished and well-developed.  Head:  Normocephalic and atraumatic. Eyes:  Without icterus, sclera clear and conjunctiva pink.  Ears:  Normal auditory acuity. Mouth:  Mask in place Lungs:  Clear to auscultation bilaterally. No wheezes, rales, or rhonchi. No distress.  Heart:  S1, S2 present without murmurs appreciated.  Abdomen:  +BS, soft, non-tender and non-distended. No HSM noted. No guarding or rebound. No masses appreciated.  Rectal:  Small anterior hemorrhoid tag. No thrombosed hemorrhoids. No internal mass. No gross blood. No obvious fissure Msk:  Symmetrical without gross deformities. Normal posture. Extremities:  Without edema. Neurologic:  Alert and  oriented x4;  grossly normal neurologically. Skin:  Intact without significant lesions or rashes. Psych:  Alert and cooperative. Normal mood and affect.  ASSESSMENT: Briana Briana Valdez is a 39 y.o. female presenting today with long-standing history of mild hemorrhoids but noting acute episode of rectal pain, swelling in Aug/Sept that by description sounds like a thrombosed hemorrhoid. This resolved with supportive measures over 3 weeks. She has noted several years of intermittent low-volume painless hematochezia that is likely benign anorectal source. I suspect she has internal hemorrhoids as culprit for hematochezia. Maternal grandmother with colon cancer at an older age but no first-degree relatives. No prior colonoscopy.  We discussed colonoscopy due to low-volume hematochezia, and we briefly discussed internal hemorrhoid banding if becomes more symptomatic in future. She is doing well currently, avoiding straining, taking stool softener, and limiting toilet time.    PLAN: Proceed with colonoscopy by Dr. Abbey Briana Valdez  in near future: the risks, benefits, and  alternatives have been discussed with the patient in detail. The patient states understanding and desires to proceed.   Continue supportive measures  Further recommendations to follow   Briana Needs, PhD, ANP-BC [facility]

## 2020-08-02 ENCOUNTER — Ambulatory Visit (INDEPENDENT_AMBULATORY_CARE_PROVIDER_SITE_OTHER): Payer: BC Managed Care – PPO | Admitting: Family Medicine

## 2020-08-02 ENCOUNTER — Other Ambulatory Visit: Payer: Self-pay

## 2020-08-02 ENCOUNTER — Encounter: Payer: Self-pay | Admitting: Family Medicine

## 2020-08-02 VITALS — BP 106/72 | HR 83 | Temp 97.0°F | Ht 61.5 in | Wt 120.0 lb

## 2020-08-02 DIAGNOSIS — Z0000 Encounter for general adult medical examination without abnormal findings: Secondary | ICD-10-CM

## 2020-08-02 NOTE — Progress Notes (Signed)
° °  Subjective:    Patient ID: Briana Valdez, female    DOB: 1981/08/13, 39 y.o.   MRN: [id number]  HPI The patient comes in today for a wellness visit.   Patient is here today for wellness Trying to watch her diet trying to eat healthy.  Staying physically active.  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  Under a lot of stress but is handling it very well denies being depressed A review of their health history was completed.  A review of medications was also completed.  Any needed refills; flonase  Eating habits: health conscious  Falls/  MVA accidents in past few months: none  Regular exercise: 2 -3 times a week  Specialist pt sees on regular basis: gyn  Preventative health issues were discussed.   Additional concerns: mental and emotional health, maybe hormonal but feelings of anxiety and forgetfulness,    Review of Systems  Constitutional: Negative for activity change, appetite change and fatigue.  HENT: Negative for congestion and rhinorrhea.   Respiratory: Negative for cough and shortness of breath.   Cardiovascular: Negative for chest pain and leg swelling.  Gastrointestinal: Negative for abdominal pain and diarrhea.  Endocrine: Negative for polydipsia and polyphagia.  Skin: Negative for color change.  Neurological: Negative for dizziness, weakness and headaches.  Psychiatric/Behavioral: Negative for behavioral problems and confusion.       Objective:   Physical Exam Vitals reviewed.  Constitutional:      General: She is not in acute distress.    Appearance: She is well-nourished.  HENT:     Head: Normocephalic and atraumatic.  Eyes:     General:        Right eye: No discharge.        Left eye: No discharge.  Neck:     Trachea: No tracheal deviation.  Cardiovascular:     Rate and Rhythm: Normal rate and regular rhythm.     Heart sounds: Normal heart sounds. No murmur heard.   Pulmonary:     Effort: Pulmonary effort is normal. No  respiratory distress.     Breath sounds: Normal breath sounds.  Musculoskeletal:        General: No edema.  Lymphadenopathy:     Cervical: No cervical adenopathy.  Skin:    General: Skin is warm and dry.  Neurological:     Mental Status: She is alert.     Coordination: Coordination normal.  Psychiatric:        Mood and Affect: Mood and affect normal.        Behavior: Behavior normal.               Assessment & Plan:  Gets her female preventive health through her gynecologist Adult wellness-complete.wellness physical was conducted today. Importance of diet and exercise were discussed in detail.  In addition to this a discussion regarding safety was also covered. We also reviewed over immunizations and gave recommendations regarding current immunization needed for age.  In addition to this additional areas were also touched on including: Preventative health exams needed:  Colonoscopy patient has colonoscopy coming up in 2 months  We did talk about life balance and making sure she does not work too much  Patient was advised yearly wellness exam Recent lab work looks very good

## 2020-09-23 ENCOUNTER — Telehealth: Payer: Self-pay | Admitting: *Deleted

## 2020-09-23 NOTE — Telephone Encounter (Signed)
Called pt and made aware we needed to r/s procedure scheduled for 2/7 with Dr. Abbey Chatters. She is now on for 2/28 and advised will mail new prep instructions and new covid test appt. She voiced understanding.

## 2020-09-24 ENCOUNTER — Other Ambulatory Visit (HOSPITAL_COMMUNITY)
Admission: RE | Admit: 2020-09-24 | Discharge: 2020-09-24 | Disposition: A | Discharge status: Home / Self Care | Payer: BC Managed Care – PPO | Source: Ambulatory Visit | Attending: Internal Medicine | Admitting: Internal Medicine

## 2020-10-15 ENCOUNTER — Other Ambulatory Visit (HOSPITAL_COMMUNITY)
Admission: RE | Admit: 2020-10-15 | Discharge: 2020-10-15 | Disposition: A | Discharge status: Home / Self Care | Payer: BC Managed Care – PPO | Source: Ambulatory Visit | Attending: Internal Medicine | Admitting: Internal Medicine

## 2020-10-15 ENCOUNTER — Other Ambulatory Visit: Payer: Self-pay

## 2020-10-15 DIAGNOSIS — Z20822 Contact with and (suspected) exposure to covid-19: Secondary | ICD-10-CM | POA: Insufficient documentation

## 2020-10-15 DIAGNOSIS — Z800 Family history of malignant neoplasm of digestive organs: Secondary | ICD-10-CM | POA: Diagnosis not present

## 2020-10-15 DIAGNOSIS — K644 Residual hemorrhoidal skin tags: Secondary | ICD-10-CM | POA: Diagnosis not present

## 2020-10-15 DIAGNOSIS — Z01812 Encounter for preprocedural laboratory examination: Secondary | ICD-10-CM | POA: Insufficient documentation

## 2020-10-15 DIAGNOSIS — K648 Other hemorrhoids: Secondary | ICD-10-CM | POA: Diagnosis not present

## 2020-10-15 DIAGNOSIS — Z79899 Other long term (current) drug therapy: Secondary | ICD-10-CM | POA: Diagnosis not present

## 2020-10-15 DIAGNOSIS — Z791 Long term (current) use of non-steroidal anti-inflammatories (NSAID): Secondary | ICD-10-CM | POA: Diagnosis not present

## 2020-10-15 DIAGNOSIS — K625 Hemorrhage of anus and rectum: Secondary | ICD-10-CM | POA: Diagnosis not present

## 2020-10-15 DIAGNOSIS — Z809 Family history of malignant neoplasm, unspecified: Secondary | ICD-10-CM | POA: Diagnosis not present

## 2020-10-15 LAB — SARS CORONAVIRUS 2 (TAT 6-24 HRS): SARS Coronavirus 2: NEGATIVE

## 2020-10-15 LAB — PREGNANCY, URINE: Preg Test, Ur: NEGATIVE

## 2020-10-18 ENCOUNTER — Encounter (HOSPITAL_COMMUNITY): Payer: Self-pay | Admitting: *Deleted

## 2020-10-18 ENCOUNTER — Encounter (HOSPITAL_COMMUNITY)
Admission: RE | Disposition: A | Discharge status: Home / Self Care | Payer: Self-pay | Source: Home / Self Care | Attending: Internal Medicine

## 2020-10-18 ENCOUNTER — Ambulatory Visit (HOSPITAL_COMMUNITY)
Admission: RE | Admit: 2020-10-18 | Discharge: 2020-10-18 | Disposition: A | Discharge status: Home / Self Care | Payer: BC Managed Care – PPO | Attending: Internal Medicine | Admitting: Internal Medicine

## 2020-10-18 ENCOUNTER — Other Ambulatory Visit: Payer: Self-pay

## 2020-10-18 ENCOUNTER — Ambulatory Visit (HOSPITAL_COMMUNITY): Payer: BC Managed Care – PPO | Admitting: Anesthesiology

## 2020-10-18 DIAGNOSIS — Z809 Family history of malignant neoplasm, unspecified: Secondary | ICD-10-CM | POA: Diagnosis not present

## 2020-10-18 DIAGNOSIS — K625 Hemorrhage of anus and rectum: Secondary | ICD-10-CM

## 2020-10-18 DIAGNOSIS — Z20822 Contact with and (suspected) exposure to covid-19: Secondary | ICD-10-CM | POA: Insufficient documentation

## 2020-10-18 DIAGNOSIS — Z79899 Other long term (current) drug therapy: Secondary | ICD-10-CM | POA: Insufficient documentation

## 2020-10-18 DIAGNOSIS — Z800 Family history of malignant neoplasm of digestive organs: Secondary | ICD-10-CM | POA: Diagnosis not present

## 2020-10-18 DIAGNOSIS — K644 Residual hemorrhoidal skin tags: Secondary | ICD-10-CM | POA: Insufficient documentation

## 2020-10-18 DIAGNOSIS — K648 Other hemorrhoids: Secondary | ICD-10-CM | POA: Diagnosis not present

## 2020-10-18 DIAGNOSIS — Z791 Long term (current) use of non-steroidal anti-inflammatories (NSAID): Secondary | ICD-10-CM | POA: Insufficient documentation

## 2020-10-18 HISTORY — PX: COLONOSCOPY WITH PROPOFOL: SHX5780

## 2020-10-18 SURGERY — COLONOSCOPY WITH PROPOFOL
Anesthesia: General

## 2020-10-18 MED ORDER — PROPOFOL 10 MG/ML IV BOLUS
INTRAVENOUS | Status: DC | PRN
Start: 2020-10-18 — End: 2020-10-18
  Administered 2020-10-18: 20 mg via INTRAVENOUS

## 2020-10-18 MED ORDER — KETAMINE HCL 10 MG/ML IJ SOLN
INTRAMUSCULAR | Status: DC | PRN
  Administered 2020-10-18: 10 mg via INTRAVENOUS
  Administered 2020-10-18: 5 mg via INTRAVENOUS

## 2020-10-18 MED ORDER — KETAMINE HCL 50 MG/5ML IJ SOSY
PREFILLED_SYRINGE | INTRAMUSCULAR | Status: AC
  Filled 2020-10-18: qty 5

## 2020-10-18 MED ORDER — PROPOFOL 500 MG/50ML IV EMUL
INTRAVENOUS | Status: DC | PRN
  Administered 2020-10-18: 150 ug/kg/min via INTRAVENOUS

## 2020-10-18 MED ORDER — LACTATED RINGERS IV SOLN: INTRAVENOUS | Status: DC

## 2020-10-18 MED ORDER — LIDOCAINE HCL (CARDIAC) PF 100 MG/5ML IV SOSY
PREFILLED_SYRINGE | INTRAVENOUS | Status: DC | PRN
  Administered 2020-10-18: 50 mg via INTRATRACHEAL

## 2020-10-18 MED ORDER — STERILE WATER FOR IRRIGATION IR SOLN
Status: DC | PRN
  Administered 2020-10-18: 200 mL

## 2020-10-18 NOTE — Discharge Instructions (Signed)
  Colonoscopy Discharge Instructions  Read the instructions outlined below and refer to this sheet in the next few weeks. These discharge instructions provide you with general information on caring for yourself after you leave the hospital. Your doctor may also give you specific instructions. While your treatment has been planned according to the most current medical practices available, unavoidable complications occasionally occur.   ACTIVITY  You may resume your regular activity, but move at a slower pace for the next 24 hours.   Take frequent rest periods for the next 24 hours.   Walking will help get rid of the air and reduce the bloated feeling in your belly (abdomen).   No driving for 24 hours (because of the medicine (anesthesia) used during the test).    Do not sign any important legal documents or operate any machinery for 24 hours (because of the anesthesia used during the test).  NUTRITION  Drink plenty of fluids.   You may resume your normal diet as instructed by your doctor.   Begin with a light meal and progress to your normal diet. Heavy or fried foods are harder to digest and may make you feel sick to your stomach (nauseated).   Avoid alcoholic beverages for 24 hours or as instructed.  MEDICATIONS  You may resume your normal medications unless your doctor tells you otherwise.  WHAT YOU CAN EXPECT TODAY  Some feelings of bloating in the abdomen.   Passage of more gas than usual.   Spotting of blood in your stool or on the toilet paper.  IF YOU HAD POLYPS REMOVED DURING THE COLONOSCOPY:  No aspirin products for 7 days or as instructed.   No alcohol for 7 days or as instructed.   Eat a soft diet for the next 24 hours.  FINDING OUT THE RESULTS OF YOUR TEST Not all test results are available during your visit. If your test results are not back during the visit, make an appointment with your caregiver to find out the results. Do not assume everything is normal if  you have not heard from your caregiver or the medical facility. It is important for you to follow up on all of your test results.  SEEK IMMEDIATE MEDICAL ATTENTION IF:  You have more than a spotting of blood in your stool.   Your belly is swollen (abdominal distention).   You are nauseated or vomiting.   You have a temperature over 101.   You have abdominal pain or discomfort that is severe or gets worse throughout the day.   Your colonoscopy was relatively unremarkable. I did not find a polyps or evidence of colon cancer. I did not find any underlying inflammation consistent with inflammatory bowel disease. You do have internal hemorrhoids which is likely the cause of your bleeding. We will set you up for hemorrhoid banding with Roseanne Kaufman at next available appointment. We will plan a repeat colonoscopy in 10 years for screening purposes.  I hope you have a great rest of your week!  Elon Alas. Abbey Chatters, D.O. Gastroenterology and Hepatology [facility]

## 2020-10-18 NOTE — Progress Notes (Addendum)
To Whom It may concern:  Briana Valdez had a  procedure at [facility] on 10/18/2020.  She will need an adult (husband) to stay with her for 24 hours after the procedure.

## 2020-10-18 NOTE — H&P (Signed)
Primary Care Physician:  Kathyrn Drown, MD Primary Gastroenterologist:  Dr. Abbey Chatters  Pre-Procedure History & Physical: HPI:  Briana Valdez is a 40 y.o. female is here  for a colonoscopy to be performed for rectal bleeding. Notes several year history of very low-volume painless hematochezia that is sporadic. Has had intermittent issues with external type hemorrhoids that resolved on own. However in [place] had acute episode of significant pain, swelling, and states she looked at rectum and saw "black" areas. Described as hard. Used ice packs, heat, sitz baths. Used a cream that helped. Took 3 weeks to resolve. 1 week significantly painful but then improved over next several weeks. No straining, limits toilet time, no constipation. Taking a stool softener now. 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    Past Medical History:  Diagnosis Date  . Medical history non-contributory     Past Surgical History:  Procedure Laterality Date  . PLACEMENT OF BREAST IMPLANTS      Prior to Admission medications   Medication Sig Start Date End Date Taking? Authorizing Provider  Docusate Calcium (STOOL SOFTENER PO) Take 2 tablets by mouth daily as needed (Constipation).   Yes [provider]  ibuprofen (ADVIL) 200 MG tablet Take 400 mg by mouth every 6 (six) hours as needed for mild pain or moderate pain.    [provider]  Multiple Vitamins-Minerals (EMERGEN-C IMMUNE) PACK Take 1 Package by mouth daily.    [provider]  sodium chloride (OCEAN) 0.65 % SOLN nasal spray Place 1 spray into both nostrils daily as needed for congestion.    [provider]    Allergies as of 07/28/2020  . (No Known Allergies)    Family History  Problem Relation Age of Onset  . Cancer Mother   . Colon cancer Paternal Grandmother   . Colon polyps Neg Hx     Social History   Socioeconomic History  . Marital status: Married    Spouse name: Not on file  .  Number of children: Not on file  . Years of education: Not on file  . Highest education level: Not on file  Occupational History    Comment: [name]  Tobacco Use  . Smoking status: Never Smoker  . Smokeless tobacco: Never Used  Vaping Use  . Vaping Use: Never used  Substance and Sexual Activity  . Alcohol use: No  . Drug use: No  . Sexual activity: Yes    Birth control/protection: Implant  Other Topics Concern  . Not on file  Social History Narrative  . Not on file   Social Determinants of Health   Financial Resource Strain: Not on file  Food Insecurity: Not on file  Transportation Needs: Not on file  Physical Activity: Not on file  Stress: Not on file  Social Connections: Not on file  Intimate Partner Violence: Not on file    Review of Systems: See HPI, otherwise negative ROS  Physical Exam: Vital signs in last 24 hours: Temp:  [98.6 F (37 C)] 98.6 F (37 C) (02/28 0945) Pulse Rate:  [96] 96 (02/28 0945) Resp:  [14] 14 (02/28 0945) BP: (127)/(85) 127/85 (02/28 0945) SpO2:  [99 %] 99 % (02/28 0945) Weight:  [49.9 kg] 49.9 kg (02/28 0945)   General:   Alert,  Well-developed, well-nourished, pleasant and cooperative in NAD Head:  Normocephalic and atraumatic. Eyes:  Sclera clear, no icterus.   Conjunctiva pink. Ears:  Normal auditory acuity. Nose:  No deformity,  discharge,  or lesions. Mouth:  No deformity or lesions, dentition normal. Neck:  Supple; no masses or thyromegaly. Lungs:  Clear throughout to auscultation.   No wheezes, crackles, or rhonchi. No acute distress. Heart:  Regular rate and rhythm; no murmurs, clicks, rubs,  or gallops. Abdomen:  Soft, nontender and nondistended. No masses, hepatosplenomegaly or hernias noted. Normal bowel sounds, without guarding, and without rebound.   Msk:  Symmetrical without gross deformities. Normal posture. Pulses:  Normal pulses noted. Extremities:  Without clubbing or edema. Neurologic:  Alert and  oriented x4;   grossly normal neurologically. Skin:  Intact without significant lesions or rashes. Cervical Nodes:  No significant cervical adenopathy. Psych:  Alert and cooperative. Normal mood and affect.  Impression/Plan: [name] is here for a colonoscopy to be performed for rectal bleeding.  The risks of the procedure including infection, bleed, or perforation as well as benefits, limitations, alternatives and imponderables have been reviewed with the patient. Questions have been answered. All parties agreeable.

## 2020-10-18 NOTE — Anesthesia Postprocedure Evaluation (Signed)
Anesthesia Post Note  Patient: Briana Valdez  Procedure(s) Performed: COLONOSCOPY WITH PROPOFOL (N/A )  Anesthesia Type: General Level of consciousness: awake, awake and alert and oriented Pain management: pain level controlled Vital Signs Assessment: post-procedure vital signs reviewed and stable Respiratory status: spontaneous breathing, nonlabored ventilation and respiratory function stable Cardiovascular status: stable Postop Assessment: no apparent nausea or vomiting Anesthetic complications: no   No complications documented.   Last Vitals:  Vitals:   10/18/20 0945 10/18/20 1141  BP: 127/85 (!) 97/50  Pulse: 96 83  Resp: 14 18  Temp: 37 C 36.4 C  SpO2: 99% 100%    Last Pain:  Vitals:   10/18/20 1141  TempSrc: Oral  PainSc: 0-No pain                 Rithvik Orcutt Hristova

## 2020-10-18 NOTE — Op Note (Signed)
[facility] Patient Name: Briana Valdez Procedure Date: 10/18/2020 11:03 AM MRN: [id number] Date of Birth: 1981/04/06 Attending MD: Elon Alas. Abbey Chatters DO CSN: [id number] Age: 40 Admit Type: Outpatient Procedure:                Colonoscopy Indications:              Rectal bleeding Providers:                Elon Alas. Abbey Chatters, DO, Cheval Page, [place]                            Risa Grill, Technician Referring MD:              Medicines:                See the Anesthesia note for documentation of the                            administered medications Complications:            No immediate complications. Estimated Blood Loss:     Estimated blood loss: none. Procedure:                Pre-Anesthesia Assessment:                           - The anesthesia plan was to use monitored                            anesthesia care (MAC).                           After obtaining informed consent, the colonoscope                            was passed under direct vision. Throughout the                            procedure, the patient's blood pressure, pulse, and                            oxygen saturations were monitored continuously. The                            PCF-[id number] ([id number]) was introduced through the                            anus and advanced to the the cecum, identified by                            appendiceal orifice and ileocecal valve. The                            colonoscopy was performed without difficulty. The                            patient tolerated the procedure well. The quality  of the bowel preparation was evaluated using the                            BBPS [facility] Bowel Preparation Scale) with scores                            of: Right Colon = 2 (minor amount of residual                            staining, small fragments of stool and/or opaque                            liquid, but mucosa seen well), Transverse Colon = 3                             (entire mucosa seen well with no residual staining,                            small fragments of stool or opaque liquid) and Left                            Colon = 3 (entire mucosa seen well with no residual                            staining, small fragments of stool or opaque                            liquid). The total BBPS score equals 8. The quality                            of the bowel preparation was good. Scope In: 11:21:15 AM Scope Out: 11:36:38 AM Scope Withdrawal Time: 0 hours 8 minutes 24 seconds  Total Procedure Duration: 0 hours 15 minutes 23 seconds  Findings:      Skin tags were found on perianal exam.      Non-bleeding internal hemorrhoids were found during retroflexion.      The terminal ileum appeared normal.      The exam was otherwise without abnormality. Impression:               - Perianal skin tags found on perianal exam.                           - Non-bleeding internal hemorrhoids.                           - The examined portion of the ileum was normal.                           - The examination was otherwise normal.                           - No specimens collected. Moderate Sedation:      Per Anesthesia Care Recommendation:           -  Patient has a contact number available for                            emergencies. The signs and symptoms of potential                            delayed complications were discussed with the                            patient. Return to normal activities tomorrow.                            Written discharge instructions were provided to the                            patient.                           - Resume previous diet.                           - Continue present medications.                           - Repeat colonoscopy in 10 years for screening                            purposes.                           - Return to GI clinic at the next available                             appointment with Roseanne Kaufman for hemorrhoid banding. Procedure Code(s):        --- Professional ---                           [phone], Colonoscopy, flexible; diagnostic, including                            collection of specimen(s) by brushing or washing,                            when performed (separate procedure) Diagnosis Code(s):        --- Professional ---                           K64.8, Other hemorrhoids                           K64.4, Residual hemorrhoidal skin tags                           K62.5, Hemorrhage of anus and rectum CPT copyright 2019 American Medical Association. All rights reserved. The codes documented in this report are preliminary and upon coder review may  be revised to meet  current compliance requirements. Elon Alas. Abbey Chatters, DO [place] Abbey Chatters, DO 10/18/2020 11:47:02 AM This report has been signed electronically. Number of Addenda: 0

## 2020-10-18 NOTE — Transfer of Care (Signed)
Immediate Anesthesia Transfer of Care Note  Patient: Briana Valdez  Procedure(s) Performed: COLONOSCOPY WITH PROPOFOL (N/A )  Patient Location: PACU  Anesthesia Type:General  Level of Consciousness: awake  Airway & Oxygen Therapy: Patient Spontanous Breathing  Post-op Assessment: Report given to RN and Post -op Vital signs reviewed and stable  Post vital signs: Reviewed and stable  Last Vitals:  Vitals Value Taken Time  BP    Temp    Pulse    Resp    SpO2      Last Pain:  Vitals:   10/18/20 1115  PainSc: 0-No pain      Patients Stated Pain Goal: 8 (51/10/21 1173)  Complications: No complications documented.

## 2020-10-18 NOTE — Anesthesia Preprocedure Evaluation (Addendum)
Anesthesia Evaluation  Patient identified by MRN, date of birth, ID band Patient awake    Reviewed: Allergy & Precautions, NPO status , Patient's Chart, lab work & pertinent test results  History of Anesthesia Complications Negative for: history of anesthetic complications  Airway Mallampati: I  TM Distance: >3 FB Neck ROM: Full    Dental  (+) Dental Advisory Given, Teeth Intact   Pulmonary neg pulmonary ROS,    Pulmonary exam normal breath sounds clear to auscultation       Cardiovascular Exercise Tolerance: Good Normal cardiovascular exam Rhythm:Regular Rate:Normal     Neuro/Psych negative neurological ROS  negative psych ROS   GI/Hepatic negative GI ROS, Neg liver ROS,   Endo/Other  negative endocrine ROS  Renal/GU negative Renal ROS  negative genitourinary   Musculoskeletal negative musculoskeletal ROS (+)   Abdominal   Peds negative pediatric ROS (+)  Hematology negative hematology ROS (+)   Anesthesia Other Findings   Reproductive/Obstetrics negative OB ROS                            Anesthesia Physical Anesthesia Plan  ASA: I  Anesthesia Plan: General   Post-op Pain Management:    Induction: Intravenous  PONV Risk Score and Plan: TIVA  Airway Management Planned: Nasal Cannula and Natural Airway  Additional Equipment:   Intra-op Plan:   Post-operative Plan:   Informed Consent: I have reviewed the patients History and Physical, chart, labs and discussed the procedure including the risks, benefits and alternatives for the proposed anesthesia with the patient or authorized representative who has indicated his/her understanding and acceptance.     Dental advisory given  Plan Discussed with: CRNA and Surgeon  Anesthesia Plan Comments:         Anesthesia Quick Evaluation

## 2020-10-21 ENCOUNTER — Encounter (HOSPITAL_COMMUNITY): Payer: Self-pay | Admitting: Internal Medicine

## 2020-11-22 ENCOUNTER — Encounter: Payer: Self-pay | Admitting: Family Medicine

## 2020-11-22 ENCOUNTER — Other Ambulatory Visit: Payer: Self-pay

## 2020-11-22 ENCOUNTER — Ambulatory Visit (INDEPENDENT_AMBULATORY_CARE_PROVIDER_SITE_OTHER): Payer: BC Managed Care – PPO | Admitting: Family Medicine

## 2020-11-22 VITALS — BP 130/80 | HR 80 | Temp 98.4°F | Ht 61.0 in | Wt 120.0 lb

## 2020-11-22 DIAGNOSIS — R519 Headache, unspecified: Secondary | ICD-10-CM

## 2020-11-22 NOTE — Progress Notes (Signed)
Patient ID: Briana Valdez, female    DOB: April 10, 1981, 40 y.o.   MRN: [id number]   Chief Complaint  Patient presents with  . Headache   Subjective:  CC: new onset headache  This is a new problem.  Presents today with a complaint right sided headache.  Pain starts at the base of the neck radiates to the right forehead.  Symptoms started early Saturday morning, awakening from sleep.  Denies any recent fever, chills, illness.  Reports neck soreness, range of motion and neck is intact.  Does not currently have a headache in the office.  Last headache was at 2:00 this morning when it awakened her from her sleep.  Reports she has been awakened from her sleep with this headache for the last 3 mornings.  She is concerned that something serious is wrong.  She has tried baby aspirin, and Sudafed.  Denies any episodes of slurred speech or facial drooping during headache.  Reports that her bottom lip "quiver".   pain on right side of head that goes from neck to forehead. Comes and goes. Started 3 days ago. Tried naproxen, ice, massage, tylenol, sudafed.  Headache does wake her from sleep. Neck is sore today and was sore last week. States she is spotting also with IUD that started the same day and has not spotted since having IUD. Thinks it might be hormonal.    Medical History Rianne has a past medical history of Medical history non-contributory.   Outpatient Encounter Medications as of 11/22/2020  Medication Sig  . Docusate Calcium (STOOL SOFTENER PO) Take 2 tablets by mouth daily as needed (Constipation).  Marland Kitchen ibuprofen (ADVIL) 200 MG tablet Take 400 mg by mouth every 6 (six) hours as needed for mild pain or moderate pain.  . Multiple Vitamins-Minerals (EMERGEN-C IMMUNE) PACK Take 1 Package by mouth daily.  . sodium chloride (OCEAN) 0.65 % SOLN nasal spray Place 1 spray into both nostrils daily as needed for congestion.   No facility-administered encounter medications on file as of 11/22/2020.      Review of Systems  Constitutional: Negative for chills and fever.  HENT: Negative for congestion and sore throat.   Respiratory: Negative for cough and shortness of breath.   Cardiovascular: Negative for chest pain.  Neurological: Positive for headaches. Negative for dizziness, weakness, light-headedness and numbness.       During headache feels tinging on top of head. No headache currently. Awaken at 0200 with headache today.   Has been awakened from sleep several times this weekend. Starting early Saturday morning.      Vitals BP 130/80   Pulse 80   Temp 98.4 F (36.9 C)   Ht 5\' 1"  (1.549 m)   Wt 120 lb (54.4 kg)   SpO2 99%   BMI 22.67 kg/m   Objective:   Physical Exam Vitals reviewed.  Constitutional:      Appearance: Normal appearance.  Eyes:     Extraocular Movements: Extraocular movements intact.     Right eye: Normal extraocular motion and no nystagmus.     Left eye: Normal extraocular motion and no nystagmus.     Pupils: Pupils are equal, round, and reactive to light.  Cardiovascular:     Rate and Rhythm: Normal rate and regular rhythm.     Heart sounds: Normal heart sounds.  Pulmonary:     Effort: Pulmonary effort is normal.     Breath sounds: Normal breath sounds.  Skin:    General: Skin is warm  and dry.  Neurological:     General: No focal deficit present.     Mental Status: She is alert.     Cranial Nerves: Cranial nerves are intact.     Motor: Motor function is intact.     Coordination: Coordination is intact.     Gait: Gait is intact.     Comments: 5/5 upper and lower extremity strength.    Headaches awakening from sleep last 3 nights from pain. Denies any neurological changes during headache. Felt lower lip "quivering" once since early Saturday morning.   Psychiatric:        Behavior: Behavior normal.                      Assessment and Plan   1. New onset headache   New onset headache that is been awakening from sleep the last 3  nights.  Neurological exam normal in the office today. Will order stat MRI without contrast to rule out stroke,  tumor, pathology.   Update:  MRI scheduled for 11/24/18. Nurse to call and discuss red flag symptoms that would require Ed evaluation since MRI not done today.   Agrees with plan of care discussed today. Understands warning signs to seek further care: chest pain, shortness of breath, any significant change in health.  Understands to follow-up once results are available.     Pecolia Ades, NP 11/22/2020

## 2020-11-23 ENCOUNTER — Ambulatory Visit (HOSPITAL_COMMUNITY)
Admission: RE | Admit: 2020-11-23 | Discharge: 2020-11-23 | Disposition: A | Discharge status: Home / Self Care | Payer: BC Managed Care – PPO | Source: Ambulatory Visit | Attending: Family Medicine | Admitting: Family Medicine

## 2020-11-23 DIAGNOSIS — R519 Headache, unspecified: Secondary | ICD-10-CM | POA: Insufficient documentation

## 2020-11-23 IMAGING — MR MR HEAD W/O CM
10 series · 48 of 48 positions shown · non-contrast
Comparison: None.

CLINICAL DATA: Right-sided headaches.

EXAM:
MRI HEAD WITHOUT CONTRAST
TECHNIQUE: Multiplanar, multiecho pulse sequences of the brain and surrounding
structures were obtained without intravenous contrast.

[Series 5: DWI · axial · 3.0mm · 1.36mm/px · z∈[-33,+107]mm · 9 of 96 slices shown (1 of 2)]
[im 1/96]
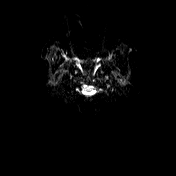
[im 12/96]
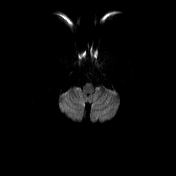
[im 24/96]
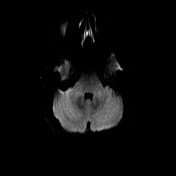
[im 36/96]
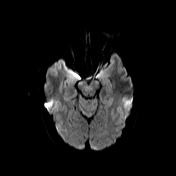
[im 48/96]
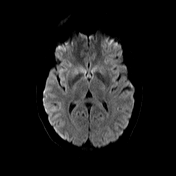
[im 60/96]
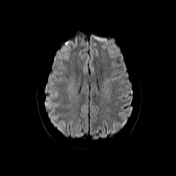
[im 72/96]
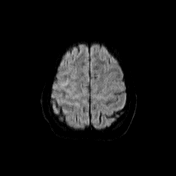
[im 84/96]
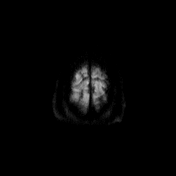
[im 96/96]
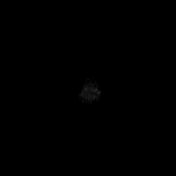

[Series 6: DWI · axial · 3.0mm · 1.36mm/px · z∈[-33,+107]mm · 4 of 45 slices shown (2 of 2)]
[im 1/45]
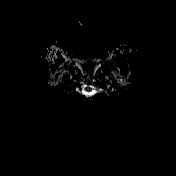
[im 15/45]
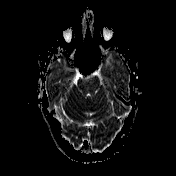
[im 30/45]
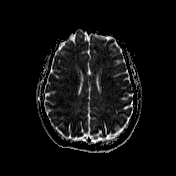
[im 45/45]
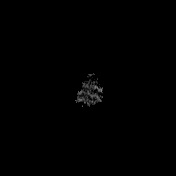

[Series 7: T1 · sagittal · 5.0mm · 0.75mm/px · 2 of 24 slices shown (1 of 2)]
[im 1/24]
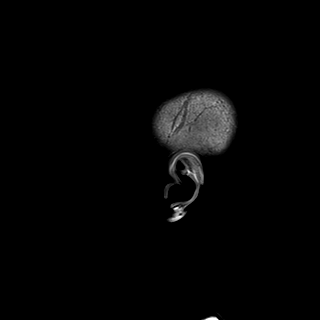
[im 24/24]
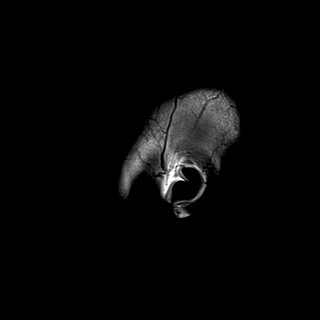

[Series 8: T2 · axial · 5.0mm · 0.62mm/px · z∈[-46,+116]mm · 2 of 26 slices shown (1 of 2)]
[im 1/26]
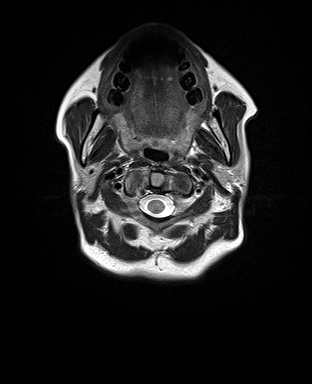
[im 26/26]
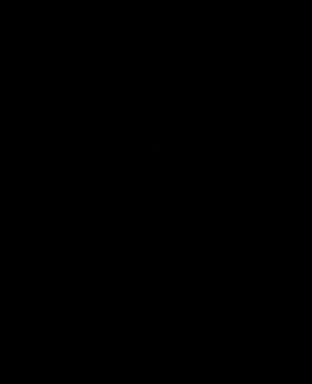

[Series 10: swi_images · axial · 3.0mm · 0.75mm/px · z∈[-71,+141]mm · 6 of 72 slices shown]
[im 1/72]
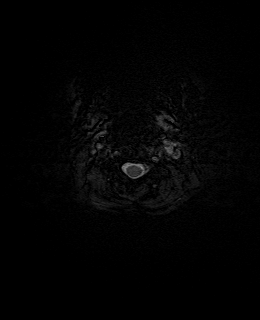
[im 15/72]
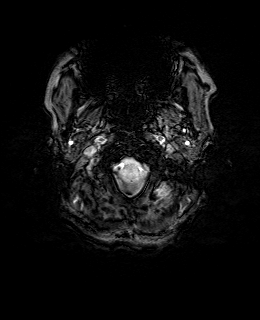
[im 29/72]
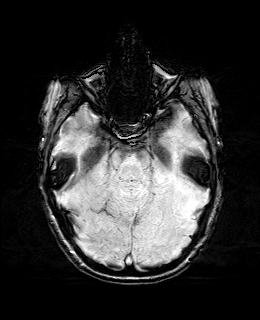
[im 43/72]
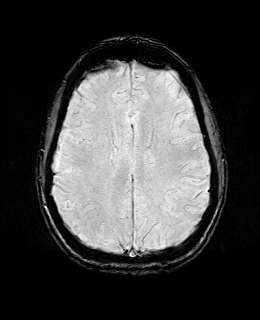
[im 57/72]
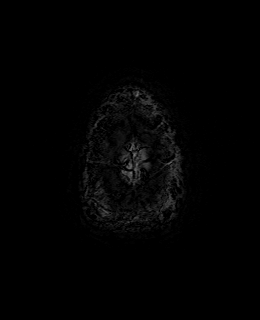
[im 72/72]
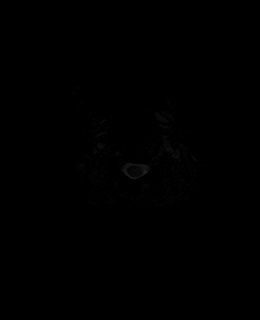

[Series 11: FLAIR · axial · 3.0mm · 0.75mm/px · z∈[-41,+111]mm · 4 of 52 slices shown]
[im 1/52]
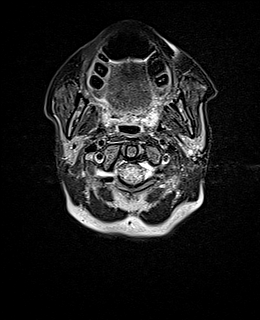
[im 18/52]
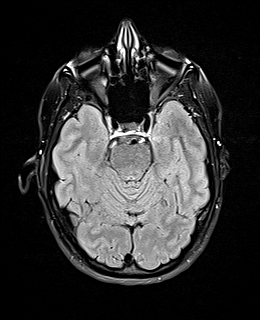
[im 35/52]
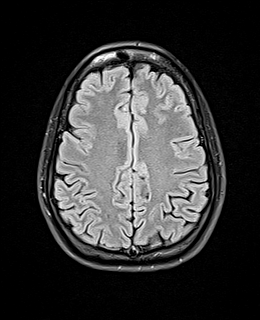
[im 52/52]
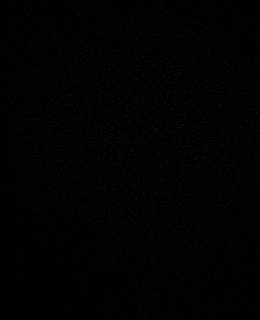

[Series 12: T1 · axial · 1.0mm · 0.94mm/px · z∈[-36,+106]mm · 12 of 144 slices shown (2 of 2)]
[im 1/144]
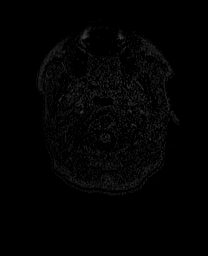
[im 14/144]
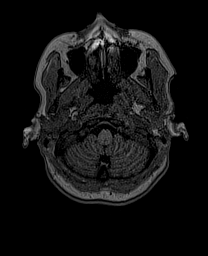
[im 27/144]
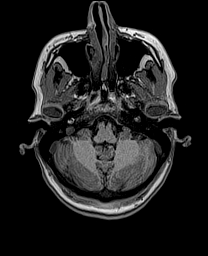
[im 40/144]
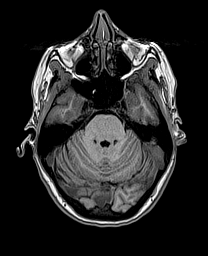
[im 53/144]
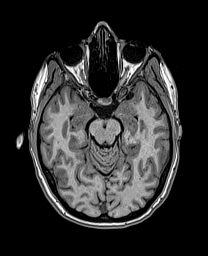
[im 66/144]
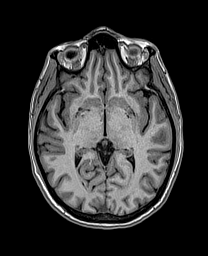
[im 79/144]
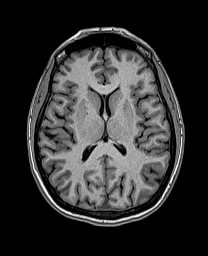
[im 92/144]
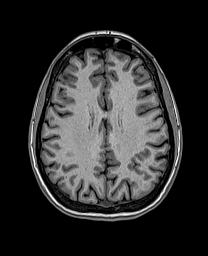
[im 105/144]
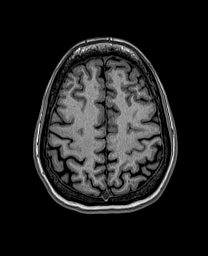
[im 118/144]
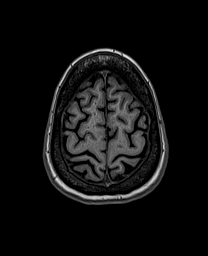
[im 131/144]
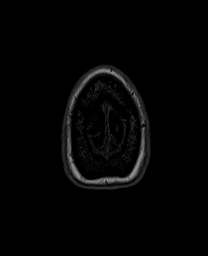
[im 144/144]
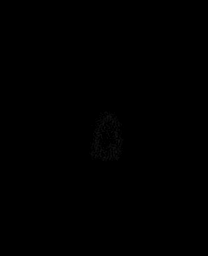

[Series 13: cor dwi_tracew · coronal · 5.0mm · 1.53mm/px · 4 of 50 slices shown]
[im 1/50]
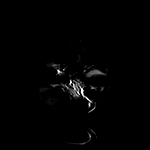
[im 17/50]
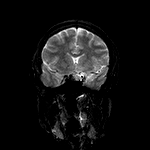
[im 33/50]
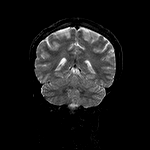
[im 50/50]
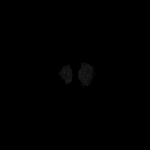

[Series 14: cor dwi_adc · coronal · 5.0mm · 1.53mm/px · 2 of 25 slices shown]
[im 1/25]
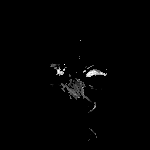
[im 25/25]
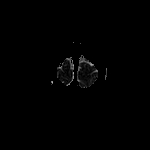

[Series 15: T2 · coronal · 5.0mm · 0.57mm/px · 3 of 32 slices shown (2 of 2)]
[im 1/32]
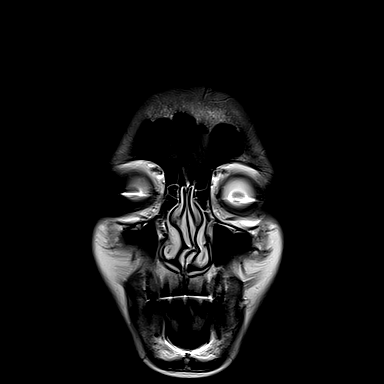
[im 16/32]
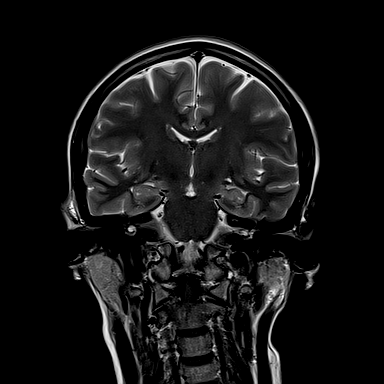
[im 32/32]
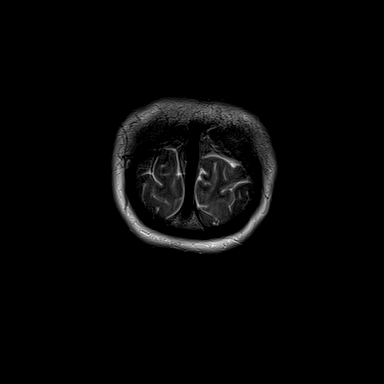

[48 of 48 positions shown; findings below may reference images not displayed]

FINDINGS: Brain: Ventricle size and cerebral volume normal. Negative for acute
infarct. Few small deep white matter hyperintensities bilaterally.
Negative for hemorrhage.

Prominent pituitary measuring 9.5 mm in height. This is touching the
optic chiasm. Sella not enlarged.

Vascular: Normal arterial flow voids

Skull and upper cervical spine: Negative

Sinuses/Orbits: Paranasal sinuses clear.  Negative orbit

Other: None
IMPRESSION: Pituitary is slightly enlarged. Possible pituitary adenoma.
Correlate with pituitary function test. Consider MRI of the brain
without with contrast with pituitary protocol.

Mild white matter changes likely due to chronic microvascular
ischemia or complex migraine headache. Negative for acute infarct.

## 2020-11-24 ENCOUNTER — Other Ambulatory Visit: Payer: Self-pay | Admitting: Family Medicine

## 2020-11-24 DIAGNOSIS — E237 Disorder of pituitary gland, unspecified: Secondary | ICD-10-CM

## 2020-11-24 DIAGNOSIS — R9389 Abnormal findings on diagnostic imaging of other specified body structures: Secondary | ICD-10-CM

## 2020-12-02 ENCOUNTER — Encounter: Payer: BC Managed Care – PPO | Admitting: Gastroenterology

## 2020-12-15 ENCOUNTER — Ambulatory Visit (HOSPITAL_COMMUNITY)
Admission: RE | Admit: 2020-12-15 | Discharge: 2020-12-15 | Disposition: A | Discharge status: Home / Self Care | Payer: BC Managed Care – PPO | Source: Ambulatory Visit | Attending: Family Medicine | Admitting: Family Medicine

## 2020-12-15 ENCOUNTER — Other Ambulatory Visit: Payer: Self-pay

## 2020-12-15 DIAGNOSIS — E236 Other disorders of pituitary gland: Secondary | ICD-10-CM | POA: Diagnosis not present

## 2020-12-15 DIAGNOSIS — E237 Disorder of pituitary gland, unspecified: Secondary | ICD-10-CM | POA: Insufficient documentation

## 2020-12-15 IMAGING — MR MR HEAD WO/W CM
17 of 18 series · 38 of 48 positions shown · IV contrast (6 ml Gadavist)
Comparison: MR head without contrast [DATE]

CLINICAL DATA: Enlarged pituitary gland

EXAM:
MRI HEAD WITHOUT AND WITH CONTRAST
TECHNIQUE: Multiplanar, multiecho pulse sequences of the brain and surrounding
structures were obtained without and with intravenous contrast.
CONTRAST:  6mL GADAVIST GADOBUTROL 1 MMOL/ML IV SOLN

[Series 5: DWI · axial · 3.0mm · 0.73mm/px · z∈[-115,+28]mm · 2 of 50 slices shown (1 of 2)]
[im 1/50]
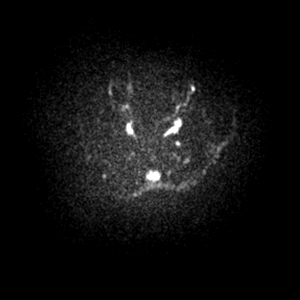
[im 50/50]
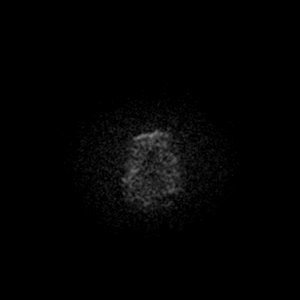

[Series 6: DWI · axial · 3.0mm · 0.73mm/px · z∈[-115,+28]mm · 2 of 50 slices shown (2 of 2)]
[im 1/50]
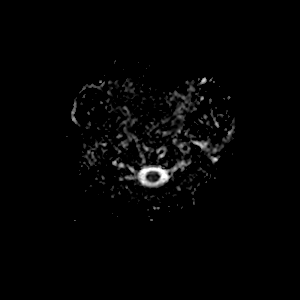
[im 50/50]
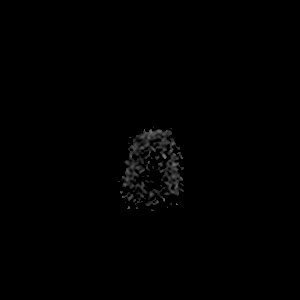

[Series 8: T2 · axial · 5.0mm · 0.72mm/px · 1 of 21 slices shown (1 of 2)]
[im 1/21]
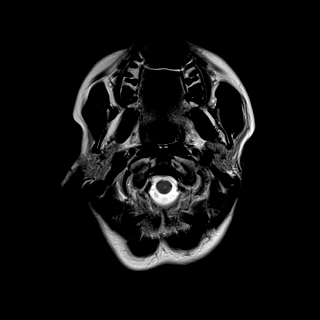

[Series 9: mag_images · axial · 3.0mm · 0.90mm/px · z∈[-117,+33]mm · 3 of 52 slices shown]
[im 1/52]
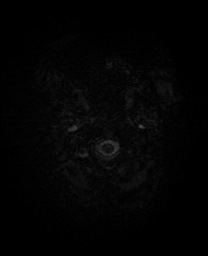
[im 26/52]
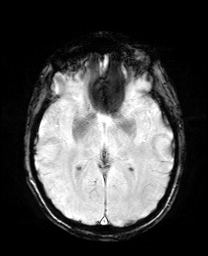
[im 52/52]
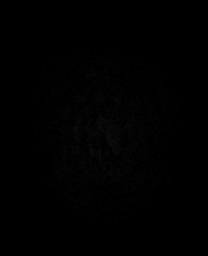

[Series 10: pha_images · axial · 3.0mm · 0.90mm/px · z∈[-117,+33]mm · 4 of 52 slices shown]
[im 1/52]
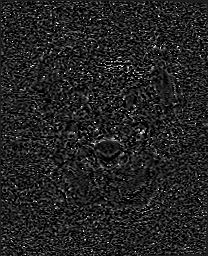
[im 18/52]
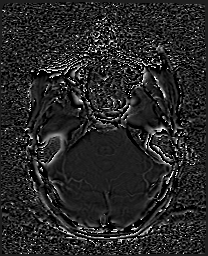
[im 35/52]
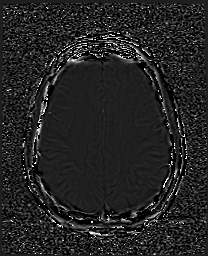
[im 52/52]
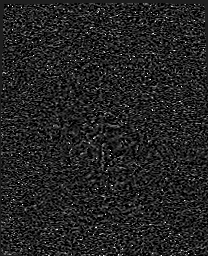

[Series 11: swi_images · axial · 3.0mm · 0.90mm/px · z∈[-117,+33]mm · 4 of 52 slices shown]
[im 1/52]
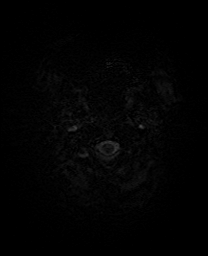
[im 18/52]
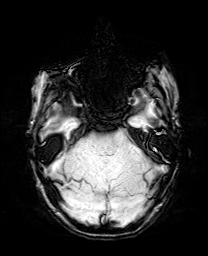
[im 35/52]
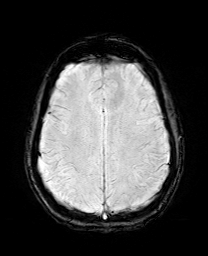
[im 52/52]
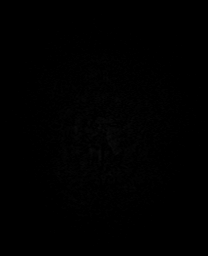

[Series 13: FLAIR · axial · 3.0mm · 0.45mm/px · z∈[-108,+30]mm · 3 of 48 slices shown]
[im 1/48]
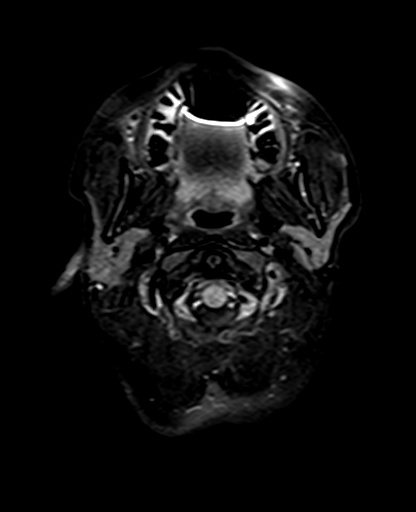
[im 24/48]
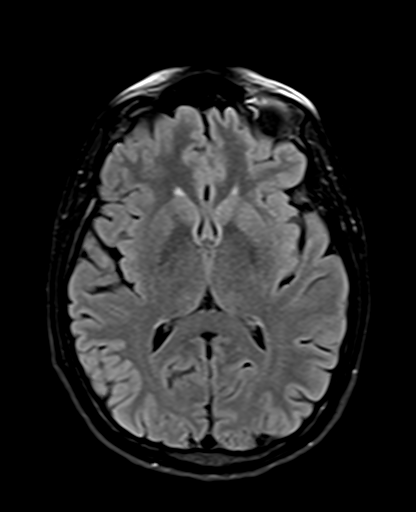
[im 48/48]
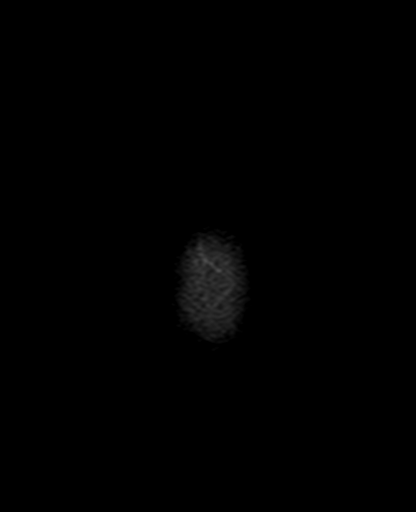

[Series 14: T1 · sagittal · 3.0mm · 0.25mm/px · 1 of 12 slices shown (1 of 4)]
[im 1/12]
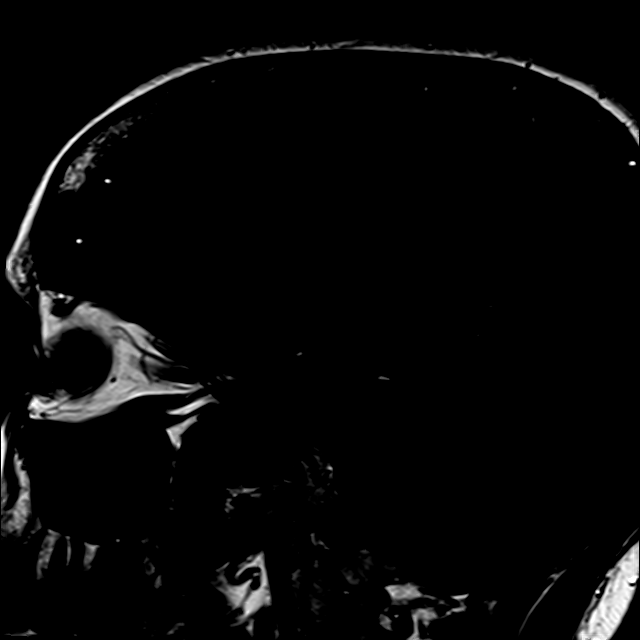

[Series 15: T1 · sagittal · 5.0mm · 0.75mm/px · 1 of 21 slices shown (2 of 4)]
[im 1/21]
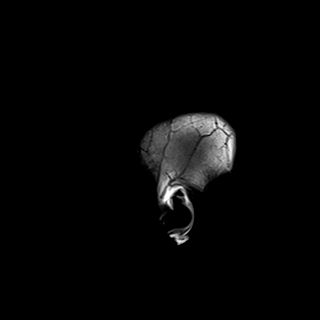

[Series 16: T1 · coronal · 3.0mm · 0.25mm/px · 1 of 13 slices shown (3 of 4)]
[im 1/13]
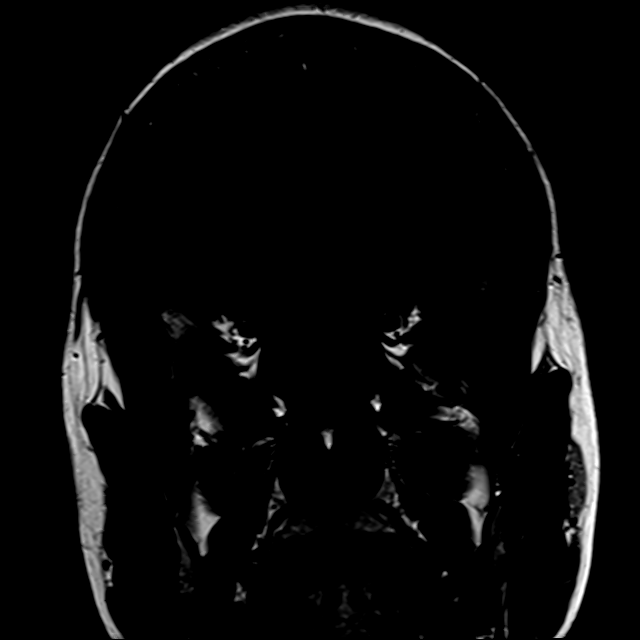

[Series 17: T2 · coronal · 3.0mm · 0.42mm/px · 1 of 13 slices shown (2 of 2)]
[im 1/13]
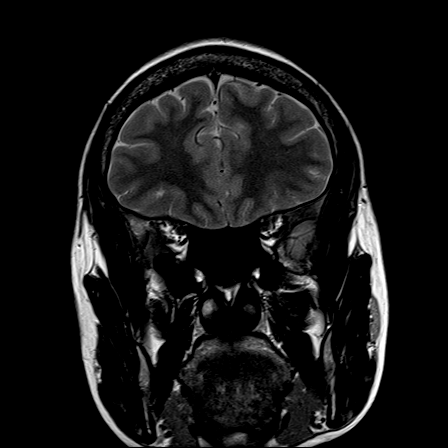

[Series 18: T1 · axial · 1.0mm · 0.98mm/px · z∈[-108,+32]mm · 10 of 144 slices shown (4 of 4)]
[im 1/144]
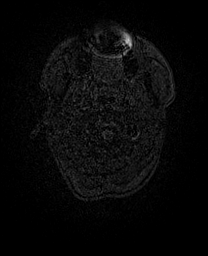
[im 16/144]
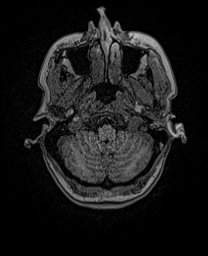
[im 32/144]
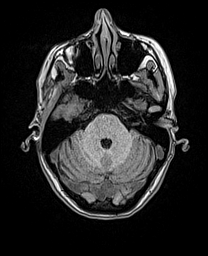
[im 48/144]
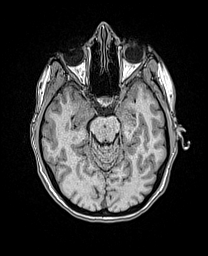
[im 64/144]
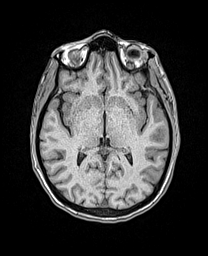
[im 80/144]
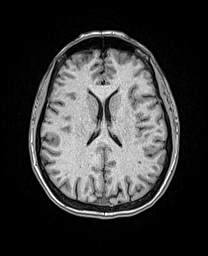
[im 96/144]
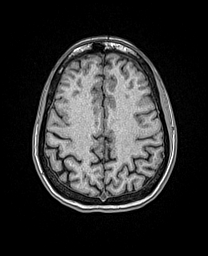
[im 112/144]
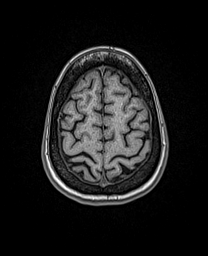
[im 128/144]
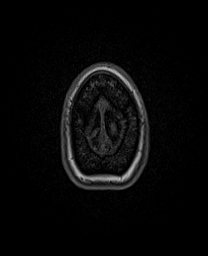
[im 144/144]
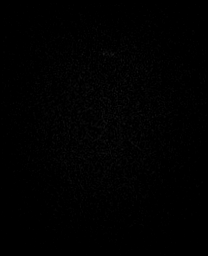

[Series 19: t1_tse_cor_dynamic pre · coronal · non-contrast · 3.0mm · 0.49mm/px · 1 of 5 slices shown]
[im 1/5]
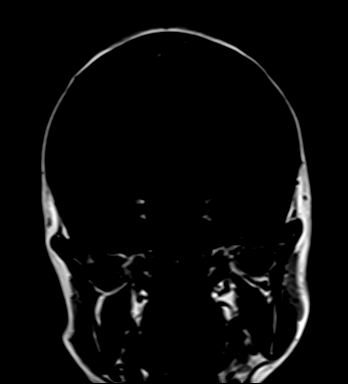

[Series 20: t1_tse_cor_dynamic post · coronal · 3.0mm · 0.49mm/px · 1 of 5 slices shown]
[im 1/5]
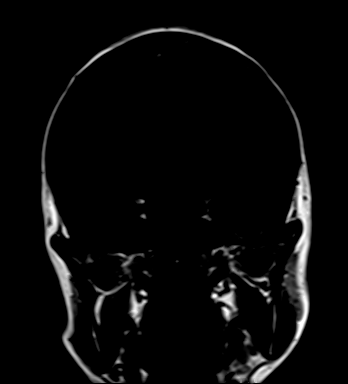

[Series 21: t1_tse_cor_dynamic post_sub · coronal · 3.0mm · 0.49mm/px · 1 of 5 slices shown]
[im 1/5]
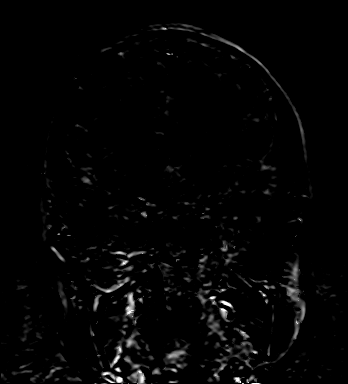

[Series 22: T1 post-contrast · sagittal · 3.0mm · 0.25mm/px · 1 of 12 slices shown (1 of 2)]
[im 1/12]
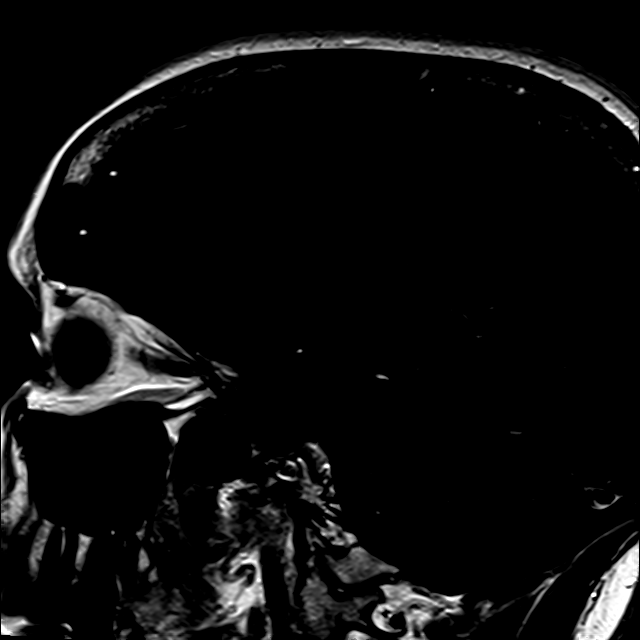

[Series 23: T1 post-contrast · coronal · 3.0mm · 0.25mm/px · 1 of 13 slices shown (2 of 2)]
[im 1/13]
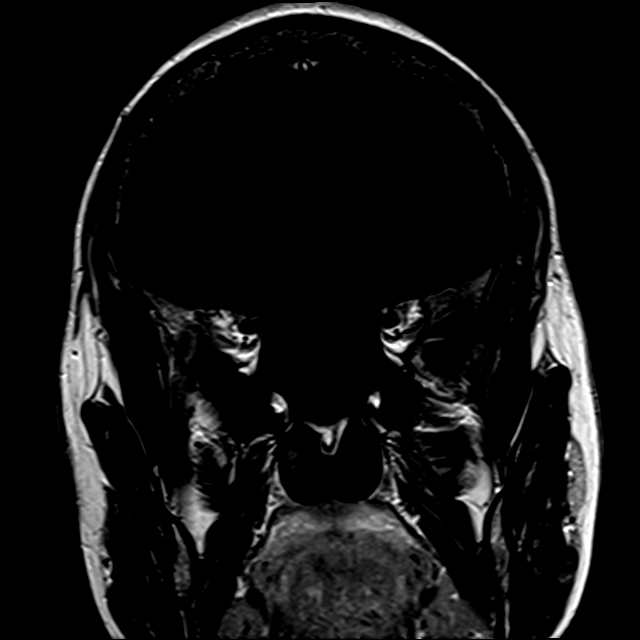

[38 of 48 positions shown; findings below may reference images not displayed]

FINDINGS: Brain: No acute infarct, hemorrhage, or mass lesion is present. A
minimal subcortical T2 hyperintensities are stable. Hemorrhage or
mass lesion is present. The ventricles are of normal size. The
internal auditory canals are within normal limits. The brainstem and
cerebellum are within normal limits.

Dedicated imaging of the sella demonstrates a hypointense area on
the postcontrast images along the left side of the gland measuring 9
x 8 x 5 mm.

The pituitary stalk is shifted the right. Pituitary gland extends to
the appear aspect of chiasm without mass effect. There is no
extension into the cavernous sinus. Optic nerves and tracts are
within limits.

Postcontrast imaging through the remainder of the brain is
unremarkable. No present.

Vascular: Flow is present in the major intracranial arteries.

Skull and upper cervical spine: The craniocervical junction is
normal. Upper cervical spine is within normal limits. Marrow signal
is unremarkable.

Sinuses/Orbits: The paranasal sinuses and mastoid air cells are
clear. The globes and orbits are within normal limits.
IMPRESSION: 1. 9 x 8 x 5 mm area of hypointense signal along the left side of
the sella compatible with a pituitary microadenoma.
2. No extension into the cavernous sinus.
3. Stable minimal subcortical T2 hyperintensities bilaterally. The
finding is nonspecific but can be seen in the setting of chronic
microvascular ischemia, a demyelinating process such as multiple
sclerosis, vasculitis, complicated migraine headaches, or as the
sequelae of a prior infectious or inflammatory process.

## 2020-12-15 MED ORDER — GADOBUTROL 1 MMOL/ML IV SOLN
6.0000 mL | Freq: Once | INTRAVENOUS | Status: AC | PRN
  Administered 2020-12-15: 6 mL via INTRAVENOUS

## 2020-12-17 ENCOUNTER — Encounter: Payer: Self-pay | Admitting: Family Medicine

## 2021-01-05 ENCOUNTER — Other Ambulatory Visit: Payer: Self-pay

## 2021-01-05 ENCOUNTER — Ambulatory Visit (INDEPENDENT_AMBULATORY_CARE_PROVIDER_SITE_OTHER): Payer: BC Managed Care – PPO | Admitting: Gastroenterology

## 2021-01-05 ENCOUNTER — Encounter: Payer: Self-pay | Admitting: Gastroenterology

## 2021-01-05 VITALS — BP 140/81 | HR 79 | Temp 97.3°F | Ht 61.0 in | Wt 119.0 lb

## 2021-01-05 DIAGNOSIS — K640 First degree hemorrhoids: Secondary | ICD-10-CM | POA: Diagnosis not present

## 2021-01-05 NOTE — Progress Notes (Signed)
Primary Care Physician:  Kathyrn Drown, MD Primary GI: Dr. Abbey Chatters  Chief Complaint  Patient presents with  . Hemorrhoids    Doing ok, unsure if she needs another banding    HPI:   RUPAL CHILDRESS is a 40 y.o. female presenting today with a history of thrombosed external hemorrhoids, internal hemorrhoids, and low-volume hematochezia. She underwent colonoscopy this year with non-bleeding internal hemorrhoids, otherwise normal. We had discussed potential banding thereafter; she is here to follow-up and discuss this.  States she has had no further bleeding. No itching, burning, pain. She is asymptomatic currently. 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 No abdominal pain. Dealing with headaches now and will be seeing Neurology due to MRI findings (pituatary adenoma?)    Past Medical History:  Diagnosis Date  . Medical history non-contributory     Past Surgical History:  Procedure Laterality Date  . COLONOSCOPY WITH PROPOFOL N/A 10/18/2020   Non-bleeding internal hemorrhoids, otherwise normal.   . PLACEMENT OF BREAST IMPLANTS      Current Outpatient Medications  Medication Sig Dispense Refill  . Docusate Calcium (STOOL SOFTENER PO) Take 2 tablets by mouth daily.    Marland Kitchen ibuprofen (ADVIL) 200 MG tablet Take 400 mg by mouth every 6 (six) hours as needed for mild pain or moderate pain.    . Multiple Vitamins-Minerals (EMERGEN-C IMMUNE) PACK Take 1 Package by mouth daily.    . sodium chloride (OCEAN) 0.65 % SOLN nasal spray Place 1 spray into both nostrils daily as needed for congestion.     No current facility-administered medications for this visit.    Allergies as of 01/05/2021  . (No Known Allergies)    Family History  Problem Relation Age of Onset  . Cancer Mother   . Colon cancer Paternal Grandmother   . Colon polyps Neg Hx     Social History   Socioeconomic History  .  Marital status: Married    Spouse name: Not on file  . Number of children: Not on file  . Years of education: Not on file  . Highest education level: Not on file  Occupational History    Comment: [name]  Tobacco Use  . Smoking status: Never Smoker  . Smokeless tobacco: Never Used  Vaping Use  . Vaping Use: Never used  Substance and Sexual Activity  . Alcohol use: No  . Drug use: No  . Sexual activity: Yes    Birth control/protection: Implant  Other Topics Concern  . Not on file  Social History Narrative  . Not on file   Social Determinants of Health   Financial Resource Strain: Not on file  Food Insecurity: Not on file  Transportation Needs: Not on file  Physical Activity: Not on file  Stress: Not on file  Social Connections: Not on file    Review of Systems: Gen: Denies fever, chills, anorexia. Denies fatigue, weakness, weight loss.  CV: Denies chest pain, palpitations, syncope, peripheral edema, and claudication. Resp: Denies dyspnea at rest, cough, wheezing, coughing up blood, and pleurisy. GI: see HPI Derm: Denies rash, itching, dry skin Psych: Denies depression, anxiety, memory loss, confusion. No homicidal or suicidal ideation.  Heme: Denies bruising, bleeding, and enlarged lymph nodes.  Physical Exam: BP 140/81   Pulse 79   Temp (!) 97.3 F (36.3 C) (Temporal)   Ht 5\' 1"  (1.549 m)   Wt 119 lb (54 kg)  BMI 22.48 kg/m  General:   Alert and oriented. No distress noted. Pleasant and cooperative.  Head:  Normocephalic and atraumatic. Eyes:  Conjuctiva clear without scleral icterus. Mouth:  Mask in place Rectal: no thrombosed hemorrhoids, external hemorrhoid tags noted. No prolapsing internal hemorrhoids. No DRE Msk:  Symmetrical without gross deformities. Normal posture. Extremities:  Without edema. Neurologic:  Alert and  oriented x4 Psych:  Alert and cooperative. Normal mood and affect.  ASSESSMENT: ARIYONA EID is a 40 y.o. female presenting today  with history of rectal bleeding due to symptomatic hemorrhoids, s/p colonoscopy in interim from last visit.  She is actually doing well and has had no issues for quite some time. She has made changes in stressors, activity, etc. We discussed holding off on banding at this time unless she becomes symptomatic. She would certainly be an ideal candidate.    PLAN:  Return prn Call if recurrent symptoms Colonoscopy in 10 years  Annitta Needs, PhD, ANP-BC [facility]

## 2021-01-05 NOTE — Patient Instructions (Signed)
I am glad you are doing well!  Please call if any recurrent symptoms. Otherwise, we will see you back as needed!   I enjoyed seeing you again today! As you know, I value our relationship and want to provide genuine, compassionate, and quality care. I welcome your feedback. If you receive a survey regarding your visit,  I greatly appreciate you taking time to fill this out. See you next time!  Annitta Needs, PhD, ANP-BC [facility]

## 2021-02-09 ENCOUNTER — Telehealth: Payer: BC Managed Care – PPO | Admitting: Physician Assistant

## 2021-02-09 DIAGNOSIS — B9689 Other specified bacterial agents as the cause of diseases classified elsewhere: Secondary | ICD-10-CM | POA: Diagnosis not present

## 2021-02-09 DIAGNOSIS — J019 Acute sinusitis, unspecified: Secondary | ICD-10-CM | POA: Diagnosis not present

## 2021-02-09 MED ORDER — AMOXICILLIN-POT CLAVULANATE 875-125 MG PO TABS: 1.0000 | ORAL_TABLET | Freq: Two times a day (BID) | ORAL | 0 refills | Status: DC

## 2021-02-09 NOTE — Progress Notes (Signed)

## 2021-02-09 NOTE — Progress Notes (Signed)
I have spent 5 minutes in review of e-visit questionnaire, review and updating patient chart, medical decision making and response to patient.   Arsema Tusing Cody Shaylene Paganelli, PA-C    

## 2021-02-14 ENCOUNTER — Telehealth: Payer: Self-pay

## 2021-02-14 ENCOUNTER — Ambulatory Visit: Payer: BC Managed Care – PPO | Admitting: Diagnostic Neuroimaging

## 2021-02-14 ENCOUNTER — Encounter: Payer: Self-pay | Admitting: Diagnostic Neuroimaging

## 2021-02-14 VITALS — BP 138/94 | HR 90 | Ht 62.0 in | Wt 121.0 lb

## 2021-02-14 DIAGNOSIS — R6889 Other general symptoms and signs: Secondary | ICD-10-CM | POA: Diagnosis not present

## 2021-02-14 DIAGNOSIS — D352 Benign neoplasm of pituitary gland: Secondary | ICD-10-CM

## 2021-02-14 NOTE — Patient Instructions (Signed)
-   vision field testing (per Dr. Rosana Hoes; [facility] care) - check pituitary hormone labs - refer to neurosurgery consult after labs and vision field testing completed

## 2021-02-14 NOTE — Progress Notes (Signed)
[facility]  PATIENT: Briana Valdez DOB: Oct 19, 1980  REFERRING CLINICIAN: Chalmers Guest, NP HISTORY FROM: patient  REASON FOR VISIT: new consult    HISTORICAL  CHIEF COMPLAINT:  Chief Complaint  Patient presents with   Abnormal MRI brain    Rm 7 New Pt "MRI done due to headaches that woke me up"    HISTORY OF PRESENT ILLNESS:   40 year old female here for evaluation of pituitary lesion.  April 2020 patient started having new onset of right sided frontal headaches.  No specific associated factors.  No prior similar headaches.  Headaches fluctuated over several days.  She had MRI of the brain which showed a pituitary lesion.  Headaches subsided following the scan.  She went back for follow-up MRI scan with and without contrast which demonstrated possible pituitary adenoma.  Headaches have essentially resolved except for a few days ago she had a minor headache.  She has had some episodes of motion sickness.  Patient's mother may have had migraines.   REVIEW OF SYSTEMS: Full 14 system review of systems performed and negative with exception of: as per HPI.  ALLERGIES: No Known Allergies  HOME MEDICATIONS: Outpatient Medications Prior to Visit  Medication Sig Dispense Refill   amoxicillin-clavulanate (AUGMENTIN) 875-125 MG tablet Take 1 tablet by mouth 2 (two) times daily. 14 tablet 0   Docusate Calcium (STOOL SOFTENER PO) Take 2 tablets by mouth daily.     ibuprofen (ADVIL) 200 MG tablet Take 400 mg by mouth every 6 (six) hours as needed for mild pain or moderate pain.     Multiple Vitamins-Minerals (EMERGEN-C IMMUNE) PACK Take 1 Package by mouth daily.     sodium chloride (OCEAN) 0.65 % SOLN nasal spray Place 1 spray into both nostrils daily as needed for congestion.     No facility-administered medications prior to visit.    PAST MEDICAL HISTORY: Past Medical History:  Diagnosis Date   Medical history non-contributory     PAST SURGICAL  HISTORY: Past Surgical History:  Procedure Laterality Date   COLONOSCOPY WITH PROPOFOL N/A 10/18/2020   Non-bleeding internal hemorrhoids, otherwise normal.    PLACEMENT OF BREAST IMPLANTS      FAMILY HISTORY: Family History  Problem Relation Age of Onset   Cancer Mother        breast   Diabetes Father    Hypertension Father    Colon cancer Paternal Grandmother    Colon polyps Neg Hx     SOCIAL HISTORY: Social History   Socioeconomic History   Marital status: Married    Spouse name: Holiday representative   Number of children: 3   Years of education: Not on file   Highest education level: Associate degree: occupational, Hotel manager, or vocational program  Occupational History    Comment: [name]   Occupation: Emergency planning/management officer  Tobacco Use   Smoking status: Never   Smokeless tobacco: Never  Vaping Use   Vaping Use: Never used  Substance and Sexual Activity   Alcohol use: No   Drug use: No   Sexual activity: Yes    Birth control/protection: Implant  Other Topics Concern   Not on file  Social History Narrative   Lives with family   Social Determinants of Health   Financial Resource Strain: Not on file  Food Insecurity: Not on file  Transportation Needs: Not on file  Physical Activity: Not on file  Stress: Not on file  Social Connections: Not on file  Intimate Partner Violence: Not on file  PHYSICAL EXAM  GENERAL EXAM/CONSTITUTIONAL: Vitals:  Vitals:   02/14/21 0940  BP: (!) 138/94  Pulse: 90  Weight: 121 lb (54.9 kg)  Height: 5\' 2"  (1.575 m)   Body mass index is 22.13 kg/m. Wt Readings from Last 3 Encounters:  02/14/21 121 lb (54.9 kg)  01/05/21 119 lb (54 kg)  11/22/20 120 lb (54.4 kg)   Patient is in no distress; well developed, nourished and groomed; neck is supple  CARDIOVASCULAR: Examination of carotid arteries is normal; no carotid bruits Regular rate and rhythm, no murmurs Examination of peripheral vascular system by observation and palpation is  normal  EYES: Ophthalmoscopic exam of optic discs and posterior segments is normal; no papilledema or hemorrhages No results found.  MUSCULOSKELETAL: Gait, strength, tone, movements noted in Neurologic exam below  NEUROLOGIC: MENTAL STATUS:  No flowsheet data found. awake, alert, oriented to person, place and time recent and remote memory intact normal attention and concentration language fluent, comprehension intact, naming intact fund of knowledge appropriate  CRANIAL NERVE:  2nd - no papilledema on fundoscopic exam 2nd, 3rd, 4th, 6th - pupils equal and reactive to light, visual fields full to confrontation, extraocular muscles intact, no nystagmus 5th - facial sensation symmetric 7th - facial strength symmetric 8th - hearing intact 9th - palate elevates symmetrically, uvula midline 11th - shoulder shrug symmetric 12th - tongue protrusion midline  MOTOR:  normal bulk and tone, full strength in the BUE, BLE  SENSORY:  normal and symmetric to light touch, temperature, vibration  COORDINATION:  finger-nose-finger, fine finger movements normal  REFLEXES:  deep tendon reflexes present and symmetric  GAIT/STATION:  narrow based gait     DIAGNOSTIC DATA (LABS, IMAGING, TESTING) - I reviewed patient records, labs, notes, testing and imaging myself where available.  Lab Results  Component Value Date   WBC 7.4 12/17/2017   HGB 14.8 12/17/2017   HCT 44.1 12/17/2017   MCV 95 12/17/2017   PLT 267 12/17/2017      Component Value Date/Time   NA 138 06/09/2020 0829   K 4.1 06/09/2020 0829   CL 101 06/09/2020 0829   CO2 24 06/09/2020 0829   GLUCOSE 74 06/09/2020 0829   BUN 15 06/09/2020 0829   CREATININE 0.64 06/09/2020 0829   CALCIUM 9.3 06/09/2020 0829   PROT 7.1 06/09/2020 0829   ALBUMIN 4.4 06/09/2020 0829   AST 19 06/09/2020 0829   ALT 13 06/09/2020 0829   ALKPHOS 37 (L) 06/09/2020 0829   BILITOT 0.4 06/09/2020 0829   GFRNONAA 112 06/09/2020 0829    GFRAA 129 06/09/2020 0829   Lab Results  Component Value Date   CHOL 156 06/09/2020   HDL 69 06/09/2020   LDLCALC 77 06/09/2020   TRIG 44 06/09/2020   CHOLHDL 2.3 06/09/2020   No results found for: HGBA1C No results found for: VITAMINB12 Lab Results  Component Value Date   TSH 0.527 06/09/2020    12/15/20 MRI brain (with and without) [I reviewed images myself and agree with interpretation. Size measurements are borderline 9-61mm. -VRP]  1. 9 x 8 x 5 mm area of hypointense signal along the left side of the sella compatible with a pituitary microadenoma. 2. No extension into the cavernous sinus. 3. Stable minimal subcortical T2 hyperintensities bilaterally. The finding is nonspecific but can be seen in the setting of chronic microvascular ischemia, a demyelinating process such as multiple sclerosis, vasculitis, complicated migraine headaches, or as the sequelae of a prior infectious or inflammatory process.  ASSESSMENT AND PLAN  40 y.o. year old female here with new onset headaches in April 2022 which have essentially resolved, found to have pituitary adenoma, possibly incidental finding.  We will proceed with further work-up.  Dx:  1. Pituitary adenoma ([place])      PLAN:  PITUITARY ADENOMA (borderline 9-78mm in size; slight contact upon overlying optic chiasm; likely incidentaloma, but could also be related to new onset of headaches in April 2022) - vision field testing (per Dr. Rosana Hoes; [facility] care) - check pituitary hormone labs - refer to neurosurgery consult after labs and vision field testing completed  Orders Placed This Encounter  Procedures   Prolactin   Insulin-Like Growth Factor   Luteinizing Hormone   Follicle Stimulating Hormone   ACTH   Alpha Subunit (Free)   Ambulatory referral to Neurosurgery   Return for pending test results, pending if symptoms worsen or fail to improve.    Penni Bombard, MD 12/03/4358, 16:58 AM Certified in Neurology,  Neurophysiology and Neuroimaging  [facility] [address] [place], [place] [id number] [phone]

## 2021-02-14 NOTE — Telephone Encounter (Signed)
Sent to [facility]. P: [id number]

## 2021-02-16 NOTE — Addendum Note (Signed)
Addended by: Andrey Spearman R on: 02/16/2021 05:00 PM   Modules accepted: Orders

## 2021-02-17 ENCOUNTER — Telehealth: Payer: Self-pay | Admitting: *Deleted

## 2021-02-17 DIAGNOSIS — R849 Unspecified abnormal finding in specimens from respiratory organs and thorax: Secondary | ICD-10-CM

## 2021-02-17 DIAGNOSIS — D352 Benign neoplasm of pituitary gland: Secondary | ICD-10-CM

## 2021-02-17 NOTE — Telephone Encounter (Signed)
Called patient and informed her that her labs were reviewed by Dr Leta Baptist. Some labs are slightly elevated; will proceed with neurosurgery and eye exam; also recommend endocrinology consult. Her Eye exam and neurosurgery appointments are already scheduled. She agrees to endocrinology consult. I advised Dr Leta Baptist placed that referral. Patient verbalized understanding, appreciation.

## 2021-02-18 DIAGNOSIS — D352 Benign neoplasm of pituitary gland: Secondary | ICD-10-CM | POA: Diagnosis not present

## 2021-02-19 LAB — ALPHA SUBUNIT (FREE): Alpha Subunit (Free): 0.2 ng/mL

## 2021-02-19 LAB — INSULIN-LIKE GROWTH FACTOR: Insulin-Like GF-1: 246 ng/mL — ABNORMAL HIGH (ref 74–239)

## 2021-02-19 LAB — LUTEINIZING HORMONE: LH: 1.4 m[IU]/mL

## 2021-02-19 LAB — ACTH: ACTH: 54.9 pg/mL (ref 7.2–63.3)

## 2021-02-19 LAB — FOLLICLE STIMULATING HORMONE: FSH: 6.1 m[IU]/mL

## 2021-02-19 LAB — PROLACTIN: Prolactin: 27.8 ng/mL — ABNORMAL HIGH (ref 4.8–23.3)

## 2021-02-22 ENCOUNTER — Ambulatory Visit: Payer: BC Managed Care – PPO | Admitting: Neurology

## 2021-02-22 DIAGNOSIS — D352 Benign neoplasm of pituitary gland: Secondary | ICD-10-CM | POA: Diagnosis not present

## 2021-03-07 DIAGNOSIS — D443 Neoplasm of uncertain behavior of pituitary gland: Secondary | ICD-10-CM | POA: Diagnosis not present

## 2021-03-25 ENCOUNTER — Encounter: Payer: Self-pay | Admitting: Family Medicine

## 2021-03-30 DIAGNOSIS — D352 Benign neoplasm of pituitary gland: Secondary | ICD-10-CM | POA: Diagnosis not present

## 2021-04-04 ENCOUNTER — Encounter: Payer: Self-pay | Admitting: Endocrinology

## 2021-04-04 ENCOUNTER — Other Ambulatory Visit: Payer: Self-pay

## 2021-04-04 ENCOUNTER — Ambulatory Visit (INDEPENDENT_AMBULATORY_CARE_PROVIDER_SITE_OTHER): Payer: BC Managed Care – PPO | Admitting: Endocrinology

## 2021-04-04 DIAGNOSIS — D352 Benign neoplasm of pituitary gland: Secondary | ICD-10-CM

## 2021-04-04 MED ORDER — DEXAMETHASONE 1 MG PO TABS: 1.0000 mg | ORAL_TABLET | ORAL | 0 refills | Status: DC

## 2021-04-04 MED ORDER — CABERGOLINE 0.5 MG PO TABS: 0.2500 mg | ORAL_TABLET | ORAL | 3 refills | Status: DC

## 2021-04-04 NOTE — Patient Instructions (Addendum)
I have sent a prescription to your pharmacy, to lower the prolactin. In approx 1 month, along with your follow up prolactin, you should do a "dexamethasone suppression test."  For this, you would take dexamethasone 1 mg at 10 pm (I have sent a prescription to your pharmacy), then come in for a "cortisol" blood test the next morning before 9 am.  You do not need to be fasting for this test.   Please come back for a follow-up appointment in 6 months.

## 2021-04-04 NOTE — Progress Notes (Signed)
Subjective:    Patient ID: Briana Valdez, female    DOB: 29-Jan-1981, 40 y.o.   MRN: [id number]  HPI Ref by Dr Wolfgang Phoenix.  She was found to have pituitary adenoma 4 mos ago.  She has no prior h/o pituitary probs.  She is not at risk for a pregnancy.  Headache is resolved.  She seldom has menses, due to IUD.  Opthal eval was normal.  She has intermitt left galactorrhea.  She has gained a few lbs.   Past Medical History:  Diagnosis Date   Medical history non-contributory     Past Surgical History:  Procedure Laterality Date   COLONOSCOPY WITH PROPOFOL N/A 10/18/2020   Non-bleeding internal hemorrhoids, otherwise normal.    PLACEMENT OF BREAST IMPLANTS      Social History   Socioeconomic History   Marital status: Married    Spouse name: Holiday representative   Number of children: 3   Years of education: Not on file   Highest education level: Associate degree: occupational, Hotel manager, or vocational program  Occupational History    Comment: [name]   Occupation: Emergency planning/management officer  Tobacco Use   Smoking status: Never   Smokeless tobacco: Never  Vaping Use   Vaping Use: Never used  Substance and Sexual Activity   Alcohol use: No   Drug use: No   Sexual activity: Yes    Birth control/protection: Implant  Other Topics Concern   Not on file  Social History Narrative   Lives with family   Social Determinants of Health   Financial Resource Strain: Not on file  Food Insecurity: Not on file  Transportation Needs: Not on file  Physical Activity: Not on file  Stress: Not on file  Social Connections: Not on file  Intimate Partner Violence: Not on file    Current Outpatient Medications on File Prior to Visit  Medication Sig Dispense Refill   Docusate Calcium (STOOL SOFTENER PO) Take 2 tablets by mouth daily.     ibuprofen (ADVIL) 200 MG tablet Take 400 mg by mouth every 6 (six) hours as needed for mild pain or moderate pain.     Multiple Vitamins-Minerals (EMERGEN-C IMMUNE) PACK Take 1 Package by  mouth daily.     sodium chloride (OCEAN) 0.65 % SOLN nasal spray Place 1 spray into both nostrils daily as needed for congestion.     amoxicillin-clavulanate (AUGMENTIN) 875-125 MG tablet Take 1 tablet by mouth 2 (two) times daily. 14 tablet 0   No current facility-administered medications on file prior to visit.    No Known Allergies  Family History  Problem Relation Age of Onset   Cancer Mother        breast   Diabetes Father    Hypertension Father    Colon cancer Paternal Grandmother    Colon polyps Neg Hx    Other Neg Hx     BP 110/80 (BP Location: Right Arm, Patient Position: Sitting, Cuff Size: Normal)   Pulse 65   Ht '5\' 2"'$  (1.575 m)   Wt 123 lb 3.2 oz (55.9 kg)   SpO2 99%   BMI 22.53 kg/m    Review of Systems denies polyuria, visual loss, and n/v    Objective:   Physical Exam VS: see vs page GEN: no distress HEAD: head: no deformity eyes: no periorbital swelling, no proptosis external nose and ears are normal NECK: supple, thyroid is not enlarged CHEST WALL: no deformity LUNGS: clear to auscultation CV: reg rate and rhythm, no murmur.  MUSCULOSKELETAL: gait is normal and steady EXTEMITIES: no deformity.  no leg edema NEURO:  readily moves all 4's.  sensation is intact to touch on all 4's SKIN:  Normal texture and temperature.  No rash or suspicious lesion is visible.   NODES:  None palpable at the neck PSYCH: alert, well-oriented.  Does not appear anxious nor depressed.  outside test results are reviewed: Prolactin=28 IGF-1=246 ACTH=54 Lab Results  Component Value Date   TSH 0.527 06/09/2020   MRI: imaging of the sella demonstrates a hypointense area on the postcontrast images along the left side of the gland measuring 9 x 8 x 5 mm.  I have reviewed outside records, and summarized: Pt was noted to have headache, and MRI was checked.  Pituitary adenoma was incidentally found     Assessment & Plan:  Pituitary microadenoma, new to me,   Hyperprolactinemia, prob due to the above.  Borderline high ACTH and IGF-1.  Plan is to repeat.  Patient Instructions  I have sent a prescription to your pharmacy, to lower the prolactin. In approx 1 month, along with your follow up prolactin, you should do a "dexamethasone suppression test."  For this, you would take dexamethasone 1 mg at 10 pm (I have sent a prescription to your pharmacy), then come in for a "cortisol" blood test the next morning before 9 am.  You do not need to be fasting for this test.   Please come back for a follow-up appointment in 6 months.

## 2021-04-05 DIAGNOSIS — D352 Benign neoplasm of pituitary gland: Secondary | ICD-10-CM | POA: Insufficient documentation

## 2021-05-04 ENCOUNTER — Other Ambulatory Visit: Payer: BC Managed Care – PPO

## 2021-05-04 ENCOUNTER — Other Ambulatory Visit (INDEPENDENT_AMBULATORY_CARE_PROVIDER_SITE_OTHER): Payer: BC Managed Care – PPO

## 2021-05-04 ENCOUNTER — Ambulatory Visit: Payer: BC Managed Care – PPO

## 2021-05-04 DIAGNOSIS — D352 Benign neoplasm of pituitary gland: Secondary | ICD-10-CM | POA: Diagnosis not present

## 2021-05-04 LAB — T4, FREE: Free T4: 0.95 ng/dL (ref 0.60–1.60)

## 2021-05-04 LAB — TSH: TSH: 0.66 u[IU]/mL (ref 0.35–5.50)

## 2021-05-04 LAB — CORTISOL: Cortisol, Plasma: 5.8 ug/dL

## 2021-05-09 LAB — EXTRA SPECIMEN

## 2021-05-09 LAB — ACTH: C206 ACTH: 34 pg/mL (ref 6–50)

## 2021-05-09 LAB — PROLACTIN: Prolactin: 11 ng/mL

## 2021-05-09 LAB — GROWTH HORMONE: Growth Hormone: 0.3 ng/mL (ref <=7.1)

## 2021-05-09 LAB — INSULIN-LIKE GROWTH FACTOR
IGF-I, LC/MS: 247 ng/mL (ref 52–328)
Z-Score (Female): 1.3 SD (ref ?–2.0)

## 2021-05-16 DIAGNOSIS — E237 Disorder of pituitary gland, unspecified: Secondary | ICD-10-CM | POA: Diagnosis not present

## 2021-05-16 DIAGNOSIS — Z6822 Body mass index (BMI) 22.0-22.9, adult: Secondary | ICD-10-CM | POA: Diagnosis not present

## 2021-05-16 DIAGNOSIS — Z01419 Encounter for gynecological examination (general) (routine) without abnormal findings: Secondary | ICD-10-CM | POA: Diagnosis not present

## 2021-06-10 ENCOUNTER — Encounter: Payer: Self-pay | Admitting: Family Medicine

## 2021-06-22 ENCOUNTER — Telehealth: Payer: Self-pay | Admitting: Family Medicine

## 2021-06-22 DIAGNOSIS — Z13220 Encounter for screening for lipoid disorders: Secondary | ICD-10-CM

## 2021-06-22 DIAGNOSIS — Z1329 Encounter for screening for other suspected endocrine disorder: Secondary | ICD-10-CM

## 2021-06-22 DIAGNOSIS — Z1159 Encounter for screening for other viral diseases: Secondary | ICD-10-CM

## 2021-06-22 DIAGNOSIS — Z79899 Other long term (current) drug therapy: Secondary | ICD-10-CM

## 2021-06-22 DIAGNOSIS — Z0000 Encounter for general adult medical examination without abnormal findings: Secondary | ICD-10-CM

## 2021-06-22 NOTE — Telephone Encounter (Signed)
Last labs completed 06/09/20 TSH, BMET, HEPATIC and LIPID. Please advise. Thank you

## 2021-06-22 NOTE — Telephone Encounter (Signed)
Patient needs labs for 12/19 physical appointment  CB#  [phone]

## 2021-06-24 NOTE — Telephone Encounter (Signed)
Lipid, liver, metabolic 7, TSH, hepatitis C antibody

## 2021-06-24 NOTE — Telephone Encounter (Signed)
Lab orders placed and pt is aware 

## 2021-08-01 DIAGNOSIS — Z1329 Encounter for screening for other suspected endocrine disorder: Secondary | ICD-10-CM | POA: Diagnosis not present

## 2021-08-01 DIAGNOSIS — Z13220 Encounter for screening for lipoid disorders: Secondary | ICD-10-CM | POA: Diagnosis not present

## 2021-08-01 DIAGNOSIS — Z79899 Other long term (current) drug therapy: Secondary | ICD-10-CM | POA: Diagnosis not present

## 2021-08-01 DIAGNOSIS — Z1159 Encounter for screening for other viral diseases: Secondary | ICD-10-CM | POA: Diagnosis not present

## 2021-08-01 DIAGNOSIS — Z0000 Encounter for general adult medical examination without abnormal findings: Secondary | ICD-10-CM | POA: Diagnosis not present

## 2021-08-02 LAB — LIPID PANEL
Chol/HDL Ratio: 2.2 ratio (ref 0.0–4.4)
Cholesterol, Total: 152 mg/dL (ref 100–199)
HDL: 70 mg/dL (ref >39)
LDL Chol Calc (NIH): 72 mg/dL (ref 0–99)
Triglycerides: 42 mg/dL (ref 0–149)
VLDL Cholesterol Cal: 10 mg/dL (ref 5–40)

## 2021-08-02 LAB — BASIC METABOLIC PANEL
BUN/Creatinine Ratio: 15 (ref 9–23)
BUN: 10 mg/dL (ref 6–24)
CO2: 25 mmol/L (ref 20–29)
Calcium: 9.1 mg/dL (ref 8.7–10.2)
Chloride: 101 mmol/L (ref 96–106)
Creatinine, Ser: 0.66 mg/dL (ref 0.57–1.00)
Glucose: 75 mg/dL (ref 70–99)
Potassium: 4 mmol/L (ref 3.5–5.2)
Sodium: 139 mmol/L (ref 134–144)
eGFR: 113 mL/min/{1.73_m2} (ref >59)

## 2021-08-02 LAB — HEPATIC FUNCTION PANEL
ALT: 11 IU/L (ref 0–32)
AST: 16 IU/L (ref 0–40)
Albumin: 4.4 g/dL (ref 3.8–4.8)
Alkaline Phosphatase: 38 IU/L — ABNORMAL LOW (ref 44–121)
Bilirubin Total: 0.4 mg/dL (ref 0.0–1.2)
Bilirubin, Direct: 0.13 mg/dL (ref 0.00–0.40)
Total Protein: 7 g/dL (ref 6.0–8.5)

## 2021-08-02 LAB — TSH: TSH: 0.87 u[IU]/mL (ref 0.450–4.500)

## 2021-08-02 LAB — HEPATITIS C ANTIBODY: Hep C Virus Ab: 0.1 s/co ratio (ref 0.0–0.9)

## 2021-08-08 ENCOUNTER — Other Ambulatory Visit: Payer: Self-pay

## 2021-08-08 ENCOUNTER — Ambulatory Visit (INDEPENDENT_AMBULATORY_CARE_PROVIDER_SITE_OTHER): Payer: BC Managed Care – PPO | Admitting: Family Medicine

## 2021-08-08 ENCOUNTER — Encounter: Payer: Self-pay | Admitting: Family Medicine

## 2021-08-08 VITALS — BP 116/70 | Temp 97.9°F | Ht 61.25 in | Wt 123.6 lb

## 2021-08-08 DIAGNOSIS — L989 Disorder of the skin and subcutaneous tissue, unspecified: Secondary | ICD-10-CM

## 2021-08-08 DIAGNOSIS — Z0000 Encounter for general adult medical examination without abnormal findings: Secondary | ICD-10-CM

## 2021-08-08 NOTE — Progress Notes (Signed)
° °  Subjective:    Patient ID: Briana Valdez, female    DOB: 1980/10/10, 40 y.o.   MRN: [id number]  HPI The patient comes in today for a wellness visit.  Labs were reviewed in detail cholesterol looks very good kidney function liver function look good sugar looks good  A review of their health history was completed.  A review of medications was also completed.  Any needed refills; none  Eating habits: pretty good  Falls/  MVA accidents in past few months: none  Regular exercise: not really -works 12 hrs a day  Specialist pt sees on regular basis: endocrinology, optometrist   Preventative health issues were discussed.   Additional concerns: none    Review of Systems     Objective:   Physical Exam General-in no acute distress Eyes-no discharge Lungs-respiratory rate normal, CTA CV-no murmurs,RRR Extremities skin warm dry no edema Neuro grossly normal Behavior normal, alert        Assessment & Plan:  1. Wellness examination Adult wellness-complete.wellness physical was conducted today. Importance of diet and exercise were discussed in detail.  In addition to this a discussion regarding safety was also covered. We also reviewed over immunizations and gave recommendations regarding current immunization needed for age.  In addition to this additional areas were also touched on including: Preventative health exams needed:  Colonoscopy up-to-date does not need another one until 2032  Patient was advised yearly wellness exam   2. Well adult exam Please see above Healthy diet recommended regular activity recommended patient does well managing her stress  3. Skin lesion She has an area on her left shoulder it appears to be scar tissue but it is hard to know if it could be an underlying skin disorder as well potentially even skin cancer recommend dermatology consult - Ambulatory referral to Dermatology

## 2021-09-05 DIAGNOSIS — D225 Melanocytic nevi of trunk: Secondary | ICD-10-CM | POA: Diagnosis not present

## 2021-09-05 DIAGNOSIS — D1801 Hemangioma of skin and subcutaneous tissue: Secondary | ICD-10-CM | POA: Diagnosis not present

## 2021-09-05 DIAGNOSIS — C44619 Basal cell carcinoma of skin of left upper limb, including shoulder: Secondary | ICD-10-CM | POA: Diagnosis not present

## 2021-09-08 ENCOUNTER — Other Ambulatory Visit: Payer: Self-pay | Admitting: Obstetrics

## 2021-09-08 DIAGNOSIS — Z1231 Encounter for screening mammogram for malignant neoplasm of breast: Secondary | ICD-10-CM

## 2021-09-12 ENCOUNTER — Telehealth: Payer: Self-pay | Admitting: Family Medicine

## 2021-09-12 NOTE — Telephone Encounter (Signed)
Pt verified via my chart that she is under the care of dermatology and does have a follow up appt on 10/03/20.

## 2021-09-21 ENCOUNTER — Ambulatory Visit
Admission: RE | Admit: 2021-09-21 | Discharge: 2021-09-21 | Disposition: A | Discharge status: Home / Self Care | Payer: BC Managed Care – PPO | Source: Ambulatory Visit

## 2021-09-21 ENCOUNTER — Other Ambulatory Visit: Payer: Self-pay

## 2021-09-21 DIAGNOSIS — Z1231 Encounter for screening mammogram for malignant neoplasm of breast: Secondary | ICD-10-CM | POA: Diagnosis not present

## 2021-09-21 IMAGING — MG DIGITAL SCREENING BREAST BILAT IMPLANT W/ TOMO W/ CAD
9 of 12 series · 9 of 28 positions shown · non-contrast
Comparison: Previous exam(s).

CLINICAL DATA: Screening.

EXAM:
DIGITAL SCREENING BILATERAL MAMMOGRAM WITH IMPLANTS, CAD AND
TOMOSYNTHESIS
TECHNIQUE: Bilateral screening digital craniocaudal and mediolateral oblique
mammograms were obtained. Bilateral screening digital breast
tomosynthesis was performed. The images were evaluated with
computer-aided detection. Standard and/or implant displaced views
were performed.

[L MLO]
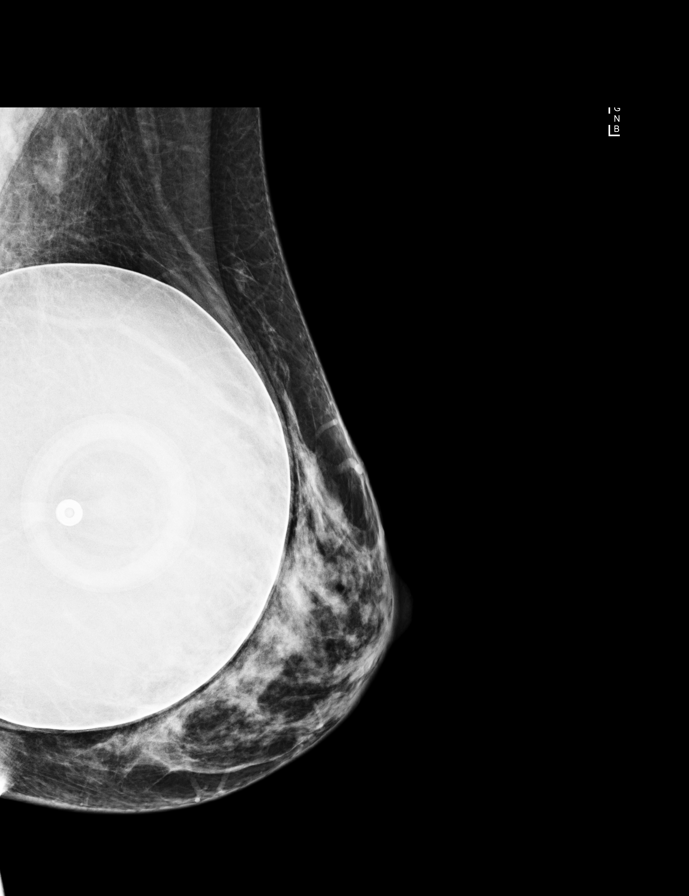

[R CC]
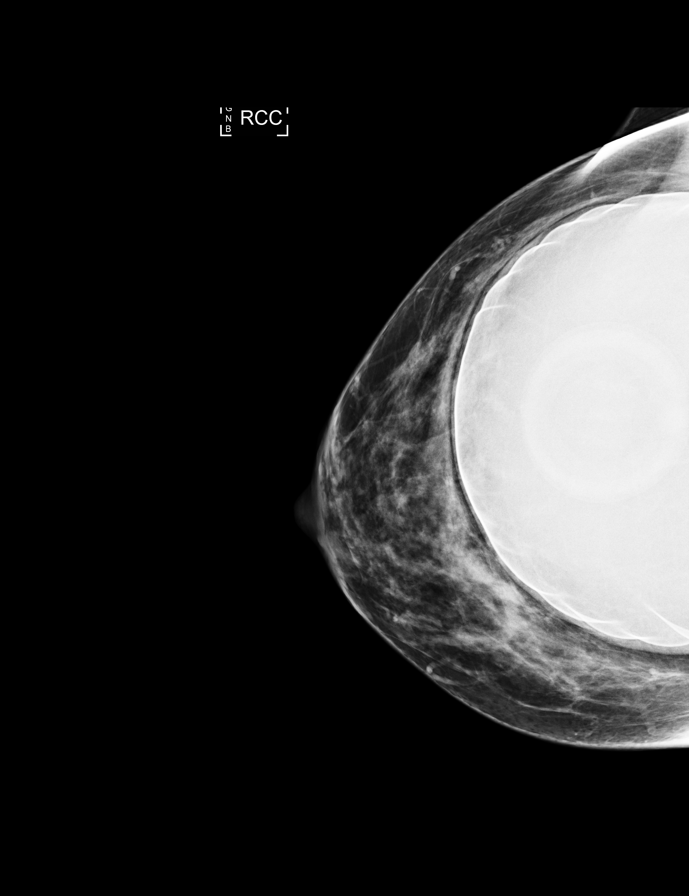

[L CC]
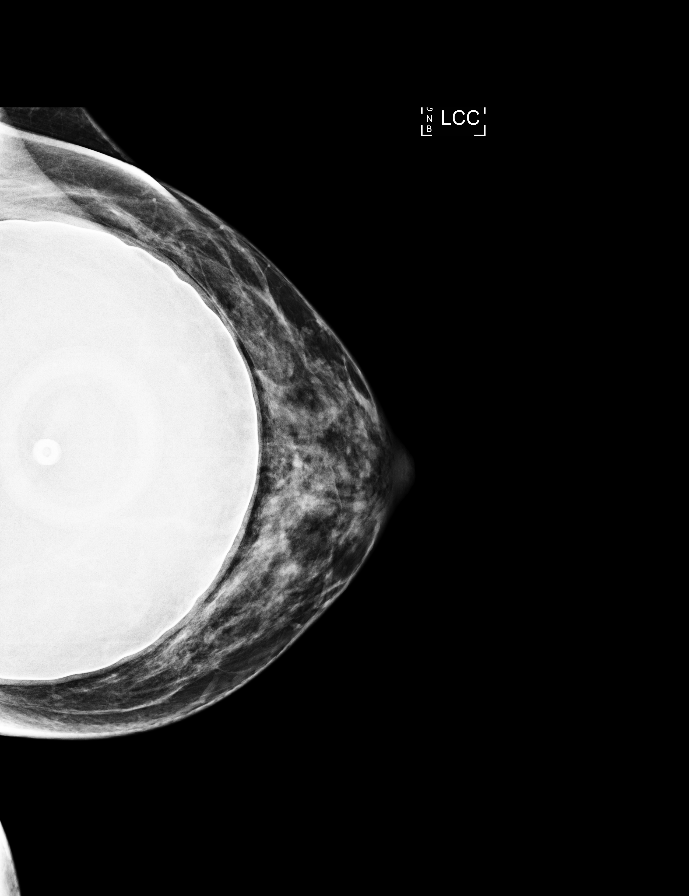

[R MLO]
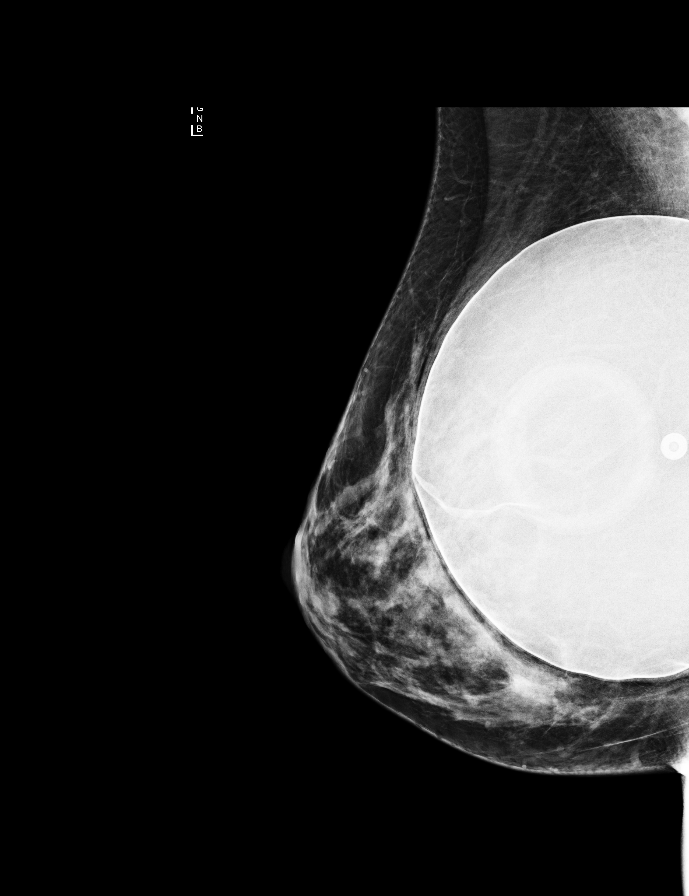

[R CC synth-2D]
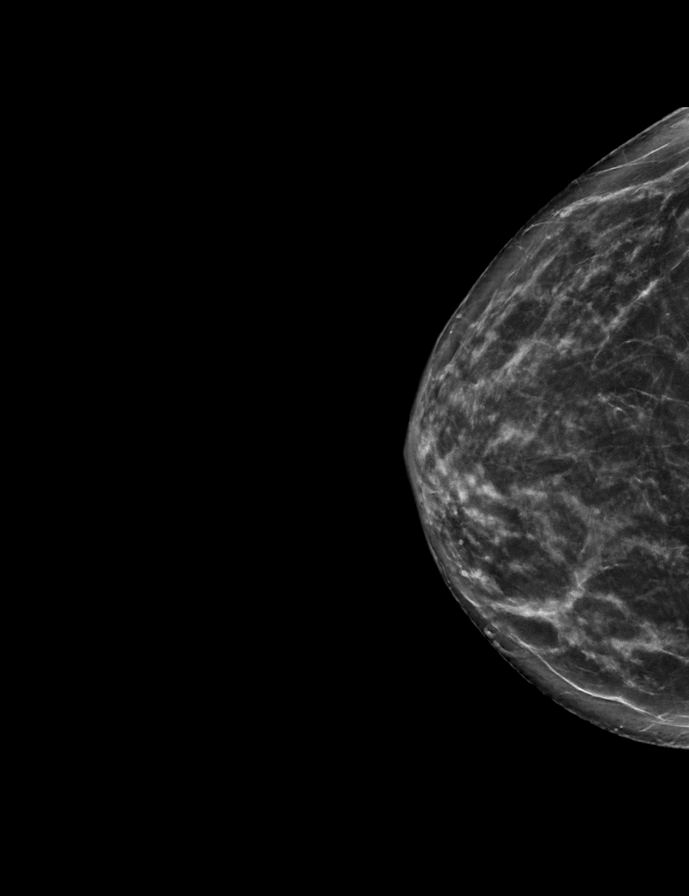

[R MLO synth-2D]
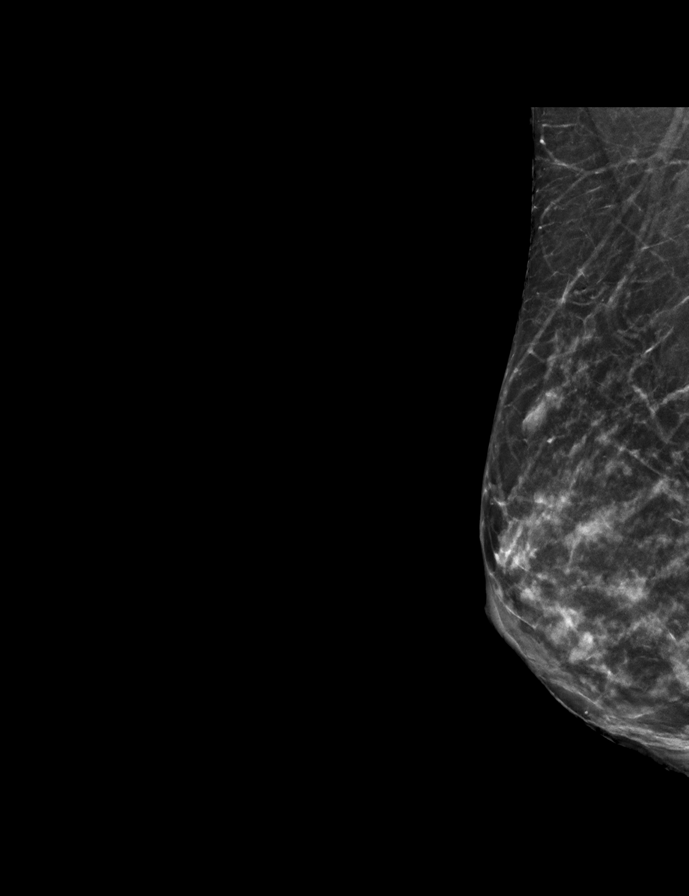

[L CC synth-2D]
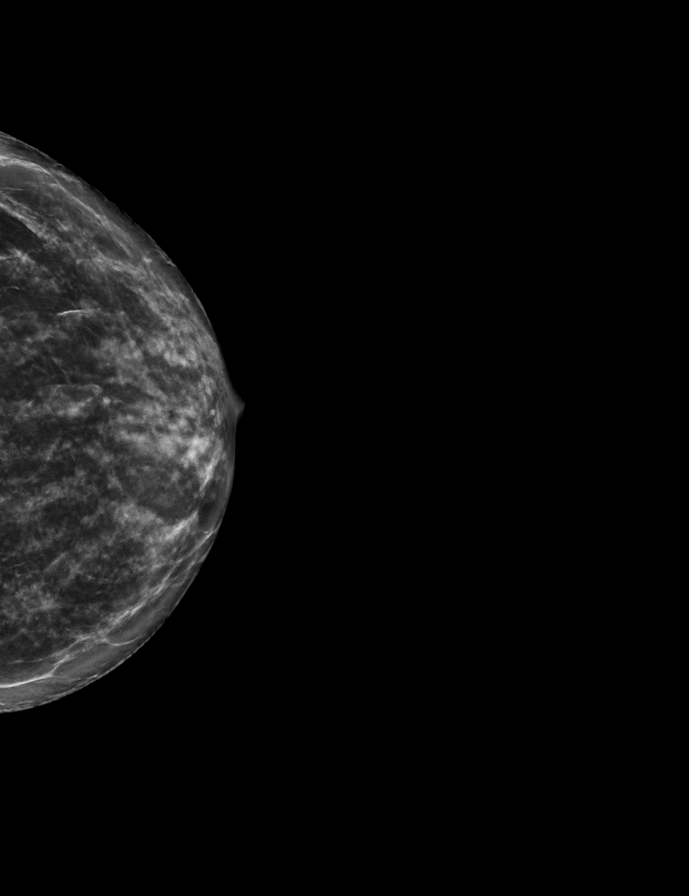

[L MLO synth-2D]
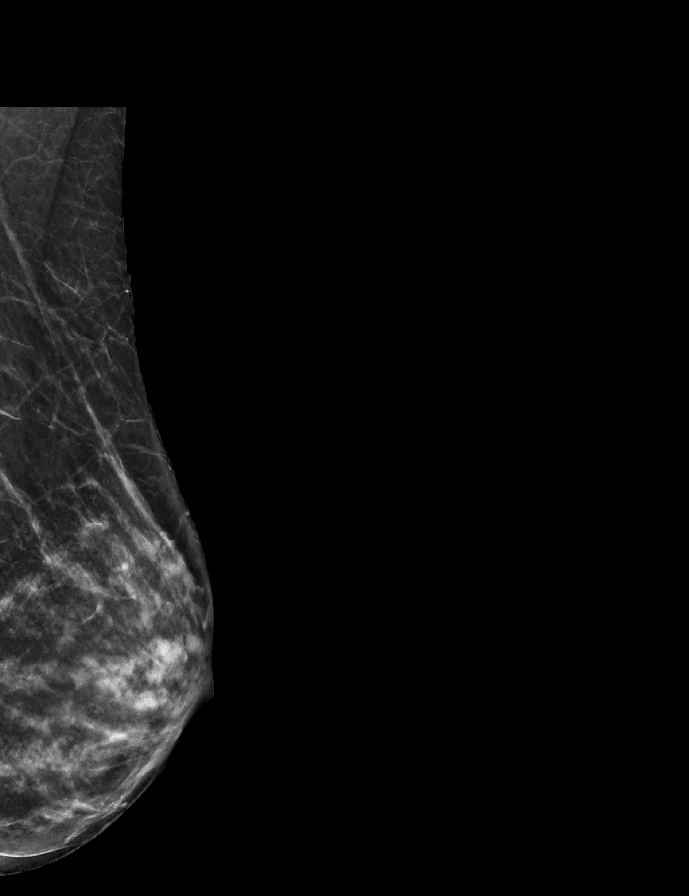

[R CCID BREAST TOMOSYNTHESIS IMAGE tomo · tomo slice 31/62.0]
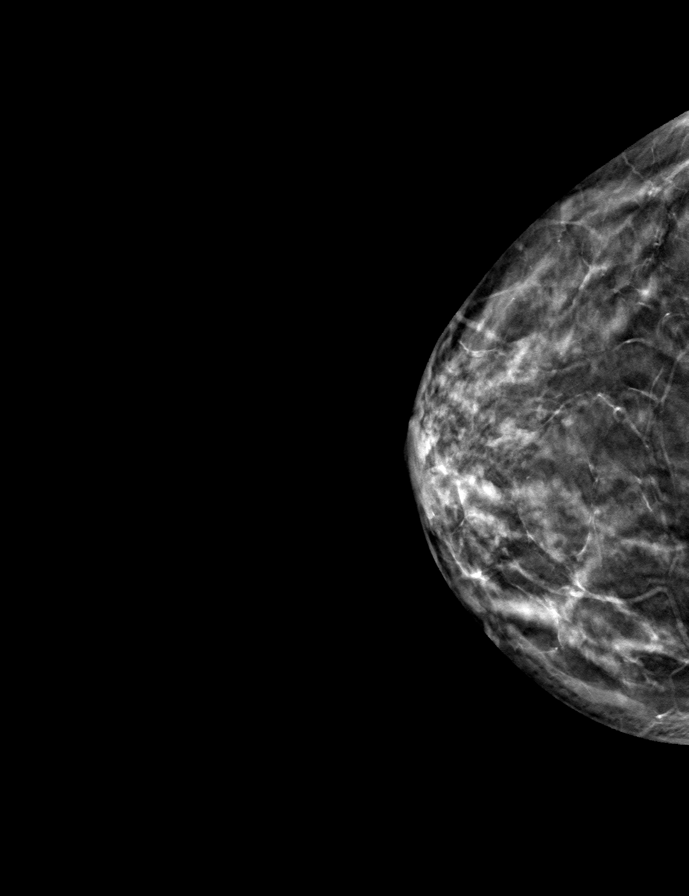

[9 of 28 positions shown; findings below may reference images not displayed]

ACR Breast Density Category c: The breast tissue is heterogeneously
dense, which may obscure small masses.
FINDINGS: The patient has retropectoral implants. There are no findings
suspicious for malignancy.
IMPRESSION: No mammographic evidence of malignancy. A result letter of this
screening mammogram will be mailed directly to the patient.

RECOMMENDATION:
Screening mammogram in one year. (Code:[LZ])

BI-RADS CATEGORY  1:  Negative.

## 2021-09-26 ENCOUNTER — Ambulatory Visit: Payer: BC Managed Care – PPO | Admitting: Endocrinology

## 2021-09-26 ENCOUNTER — Other Ambulatory Visit: Payer: Self-pay

## 2021-09-26 VITALS — BP 130/84 | HR 78 | Ht 61.25 in | Wt 124.0 lb

## 2021-09-26 DIAGNOSIS — D352 Benign neoplasm of pituitary gland: Secondary | ICD-10-CM

## 2021-09-26 DIAGNOSIS — E221 Hyperprolactinemia: Secondary | ICD-10-CM | POA: Diagnosis not present

## 2021-09-26 LAB — BASIC METABOLIC PANEL
BUN: 11 mg/dL (ref 6–23)
CO2: 31 mEq/L (ref 19–32)
Calcium: 9.2 mg/dL (ref 8.4–10.5)
Chloride: 102 mEq/L (ref 96–112)
Creatinine, Ser: 0.6 mg/dL (ref 0.40–1.20)
GFR: 111.05 mL/min (ref >60.00)
Glucose, Bld: 79 mg/dL (ref 70–99)
Potassium: 4 mEq/L (ref 3.5–5.1)
Sodium: 138 mEq/L (ref 135–145)

## 2021-09-26 LAB — PROLACTIN: Prolactin: 8.5 ng/mL

## 2021-09-26 NOTE — Progress Notes (Signed)
Subjective:    Patient ID: Briana Valdez, female    DOB: 10/02/1980, 41 y.o.   MRN: [id number]  HPI Pt returns for f/u of pituitary adenoma (dx'ed 2018; prolactin was slightly high;  other pit functions were normal; she is not at risk for a pregnancy; she seldom has menses, due to IUD; opthal eval was normal; she has intermitt left galactorrhea).  She takes cabergoline as rx'ed.  She has infrequent menses (IUD).   Past Medical History:  Diagnosis Date   Medical history non-contributory     Past Surgical History:  Procedure Laterality Date   COLONOSCOPY WITH PROPOFOL N/A 10/18/2020   Non-bleeding internal hemorrhoids, otherwise normal.    PLACEMENT OF BREAST IMPLANTS      Social History   Socioeconomic History   Marital status: Married    Spouse name: Holiday representative   Number of children: 3   Years of education: Not on file   Highest education level: Associate degree: occupational, Hotel manager, or vocational program  Occupational History    Comment: [name]   Occupation: Emergency planning/management officer  Tobacco Use   Smoking status: Never   Smokeless tobacco: Never  Vaping Use   Vaping Use: Never used  Substance and Sexual Activity   Alcohol use: No   Drug use: No   Sexual activity: Yes    Birth control/protection: Implant  Other Topics Concern   Not on file  Social History Narrative   Lives with family   Social Determinants of Health   Financial Resource Strain: Not on file  Food Insecurity: Not on file  Transportation Needs: Not on file  Physical Activity: Not on file  Stress: Not on file  Social Connections: Not on file  Intimate Partner Violence: Not on file    Current Outpatient Medications on File Prior to Visit  Medication Sig Dispense Refill   cabergoline (DOSTINEX) 0.5 MG tablet Take 0.5 tablets (0.25 mg total) by mouth 2 (two) times a week. 12 tablet 3   Docusate Calcium (STOOL SOFTENER PO) Take 2 tablets by mouth daily.     ibuprofen (ADVIL) 200 MG tablet Take 400 mg by mouth  every 6 (six) hours as needed for mild pain or moderate pain.     sodium chloride (OCEAN) 0.65 % SOLN nasal spray Place 1 spray into both nostrils daily as needed for congestion.     No current facility-administered medications on file prior to visit.    No Known Allergies  Family History  Problem Relation Age of Onset   Cancer Mother        breast   Diabetes Father    Hypertension Father    Colon cancer Paternal Grandmother    Colon polyps Neg Hx    Other Neg Hx     BP 130/84    Pulse 78    Ht 5' 1.25" (1.556 m)    Wt 124 lb (56.2 kg)    SpO2 94%    BMI 23.24 kg/m    Review of Systems Denies N/HA/lightheadedness/galactorrhea.       Objective:   Physical Exam  Lab Results  Component Value Date   CREATININE 0.60 09/26/2021   BUN 11 09/26/2021   NA 138 09/26/2021   K 4.0 09/26/2021   CL 102 09/26/2021   CO2 31 09/26/2021      Assessment & Plan:  Pit adenoma: due for f/u soon Hyperprolactinemia: uncertain relationship to the adenoma. Please continue the same cabergoline pending results  Patient Instructions  Blood tests  are requested for you today.  We'll let you know about the results.   Let's recheck the MRI.  you will receive a phone call, about a day and time for an appointment.   Please come back for a follow-up appointment in 6-12 months.

## 2021-09-26 NOTE — Patient Instructions (Addendum)
Blood tests are requested for you today.  We'll let you know about the results.   Let's recheck the MRI.  you will receive a phone call, about a day and time for an appointment.   Please come back for a follow-up appointment in 6-12 months.

## 2021-10-03 DIAGNOSIS — Z08 Encounter for follow-up examination after completed treatment for malignant neoplasm: Secondary | ICD-10-CM | POA: Diagnosis not present

## 2021-10-03 DIAGNOSIS — Z85828 Personal history of other malignant neoplasm of skin: Secondary | ICD-10-CM | POA: Diagnosis not present

## 2021-10-10 ENCOUNTER — Ambulatory Visit: Payer: BC Managed Care – PPO | Admitting: Endocrinology

## 2021-10-24 DIAGNOSIS — J0140 Acute pansinusitis, unspecified: Secondary | ICD-10-CM | POA: Diagnosis not present

## 2021-11-21 DIAGNOSIS — E237 Disorder of pituitary gland, unspecified: Secondary | ICD-10-CM | POA: Diagnosis not present

## 2021-12-19 ENCOUNTER — Encounter: Payer: Self-pay | Admitting: *Deleted

## 2021-12-22 NOTE — Telephone Encounter (Signed)
Nurses-please do the following-please also relay this communication and any additional information to [place] ?I would recommend [facility] in [place].  My second choice would be locally with [facility]. ?Please have Loma Sousa send the referral ?The vast majority of counseling places prefer for families to call directly to set the appointment ? ?Unfortunately many of the counseling places do take some time to get in with. ?If for some reason it looks like it is going to be significant length of time-then I would recommend for Leann to call her insurance company and get a list of other facilities/counseling groups that are on her plan-and then it often becomes more of a aspect of her calling around to find a place that would see her son sooner. ? ?If any of these places are asking for referral please have her let us know and then we can certainly send in the referral ?Finally have Loma Sousa send a referral to [facility] in [place] that would be a good first place to start. ?Thanks-Dr. Nicki Reaper ? ?

## 2022-01-02 ENCOUNTER — Other Ambulatory Visit: Payer: BC Managed Care – PPO

## 2022-02-14 ENCOUNTER — Telehealth: Payer: Self-pay

## 2022-02-14 DIAGNOSIS — D352 Benign neoplasm of pituitary gland: Secondary | ICD-10-CM

## 2022-02-14 MED ORDER — CABERGOLINE 0.5 MG PO TABS: 0.2500 mg | ORAL_TABLET | ORAL | 1 refills | Status: DC

## 2022-03-27 ENCOUNTER — Ambulatory Visit
Admission: EM | Admit: 2022-03-27 | Discharge: 2022-03-27 | Disposition: A | Discharge status: Home / Self Care | Payer: BC Managed Care – PPO | Attending: Family Medicine | Admitting: Family Medicine

## 2022-03-27 DIAGNOSIS — N390 Urinary tract infection, site not specified: Secondary | ICD-10-CM | POA: Insufficient documentation

## 2022-03-27 LAB — POCT URINALYSIS DIP (MANUAL ENTRY)
Bilirubin, UA: NEGATIVE
Glucose, UA: NEGATIVE mg/dL
Ketones, POC UA: NEGATIVE mg/dL
Nitrite, UA: NEGATIVE
Protein Ur, POC: NEGATIVE mg/dL
Spec Grav, UA: 1.015 (ref 1.010–1.025)
Urobilinogen, UA: 0.2 E.U./dL
pH, UA: 6 (ref 5.0–8.0)

## 2022-03-27 MED ORDER — CEPHALEXIN 500 MG PO CAPS: 500.0000 mg | ORAL_CAPSULE | Freq: Two times a day (BID) | ORAL | 0 refills | Status: DC

## 2022-03-27 NOTE — ED Triage Notes (Signed)
Pt presents with c/o dysuria and urinary frequency since yesterday

## 2022-03-28 NOTE — ED Provider Notes (Signed)
[facility]    CSN: [id number] Arrival date & time: 03/27/22  1857      History   Chief Complaint Chief Complaint  Patient presents with   Urinary Frequency    Not sure if it's bladder or UTI - Entered by patient   Dysuria    HPI Briana Valdez is a 41 y.o. female.   Presenting today with several day history of urinary frequency, dysuria that worsened last night. Denies fever, chills, abdominal pain, N/V, hematuria. Hx of UTIs that have started similarly. Trying to drink more fluids with minimal relief.     Past Medical History:  Diagnosis Date   Medical history non-contributory     Patient Active Problem List   Diagnosis Date Noted   Hyperprolactinemia ([place]) 09/26/2021   Pituitary adenoma ([place]) 04/05/2021   Grade I hemorrhoids 01/05/2021   New onset headache 11/22/2020   Rectal bleeding 07/28/2020    Past Surgical History:  Procedure Laterality Date   COLONOSCOPY WITH PROPOFOL N/A 10/18/2020   Non-bleeding internal hemorrhoids, otherwise normal.    PLACEMENT OF BREAST IMPLANTS      OB History   No obstetric history on file.      Home Medications    Prior to Admission medications   Medication Sig Start Date End Date Taking? Authorizing Provider  cephALEXin (KEFLEX) 500 MG capsule Take 1 capsule (500 mg total) by mouth 2 (two) times daily. 03/27/22  Yes Volney American, PA-C  cabergoline (DOSTINEX) 0.5 MG tablet Take 0.5 tablets (0.25 mg total) by mouth 2 (two) times a week. 02/16/22   Shamleffer, Melanie Crazier, MD  Docusate Calcium (STOOL SOFTENER PO) Take 2 tablets by mouth daily.    [provider]  ibuprofen (ADVIL) 200 MG tablet Take 400 mg by mouth every 6 (six) hours as needed for mild pain or moderate pain.    [provider]  sodium chloride (OCEAN) 0.65 % SOLN nasal spray Place 1 spray into both nostrils daily as needed for congestion.    [provider]    Family History Family History  Problem  Relation Age of Onset   Cancer Mother        breast   Diabetes Father    Hypertension Father    Colon cancer Paternal Grandmother    Colon polyps Neg Hx    Other Neg Hx     Social History Social History   Tobacco Use   Smoking status: Never   Smokeless tobacco: Never  Vaping Use   Vaping Use: Never used  Substance Use Topics   Alcohol use: No   Drug use: No     Allergies   Patient has no known allergies.   Review of Systems Review of Systems PER HPI  Physical Exam Triage Vital Signs ED Triage Vitals  Enc Vitals Group     BP 03/27/22 1938 126/83     Pulse Rate 03/27/22 1938 75     Resp 03/27/22 1938 18     Temp 03/27/22 1938 98.2 F (36.8 C)     Temp src --      SpO2 03/27/22 1938 98 %     Weight --      Height --      Head Circumference --      Peak Flow --      Pain Score 03/27/22 1936 0     Pain Loc --      Pain Edu? --      Excl.  in [place]? --    No data found.  Updated Vital Signs BP 126/83   Pulse 75   Temp 98.2 F (36.8 C)   Resp 18   SpO2 98%   Visual Acuity Right Eye Distance:   Left Eye Distance:   Bilateral Distance:    Right Eye Near:   Left Eye Near:    Bilateral Near:     Physical Exam Vitals and nursing note reviewed.  Constitutional:      Appearance: Normal appearance. She is not ill-appearing.  HENT:     Head: Atraumatic.     Mouth/Throat:     Mouth: Mucous membranes are moist.  Eyes:     Extraocular Movements: Extraocular movements intact.     Conjunctiva/sclera: Conjunctivae normal.  Cardiovascular:     Rate and Rhythm: Normal rate and regular rhythm.     Heart sounds: Normal heart sounds.  Pulmonary:     Effort: Pulmonary effort is normal.     Breath sounds: Normal breath sounds.  Abdominal:     General: Bowel sounds are normal. There is no distension.     Palpations: Abdomen is soft.     Tenderness: There is no abdominal tenderness. There is no right CVA tenderness, left CVA tenderness or guarding.   Musculoskeletal:        General: Normal range of motion.     Cervical back: Normal range of motion and neck supple.  Skin:    General: Skin is warm and dry.  Neurological:     Mental Status: She is alert and oriented to person, place, and time.  Psychiatric:        Mood and Affect: Mood normal.        Thought Content: Thought content normal.        Judgment: Judgment normal.    UC Treatments / Results  Labs (all labs ordered are listed, but only abnormal results are displayed) Labs Reviewed  POCT URINALYSIS DIP (MANUAL ENTRY) - Abnormal; Notable for the following components:      Result Value   Blood, UA moderate (*)    Leukocytes, UA Small (1+) (*)    All other components within normal limits  URINE CULTURE    EKG   Radiology No results found.  Procedures Procedures (including critical care time)  Medications Ordered in UC Medications - No data to display  Initial Impression / Assessment and Plan / UC Course  I have reviewed the triage vital signs and the nursing notes.  Pertinent labs & imaging results that were available during my care of the patient were reviewed by me and considered in my medical decision making (see chart for details).     U/a with evidence of UTI, urine culture pending, treat empirically with keflex, fluids, AZO prn. Return for worsening sxs.  Final Clinical Impressions(s) / UC Diagnoses   Final diagnoses:  Acute lower UTI   Discharge Instructions   None    ED Prescriptions     Medication Sig Dispense Auth. Provider   cephALEXin (KEFLEX) 500 MG capsule Take 1 capsule (500 mg total) by mouth 2 (two) times daily. 10 capsule Volney American, [place]      PDMP not reviewed this encounter.   Volney American, [place] 03/28/22 1849

## 2022-03-31 LAB — URINE CULTURE: Culture: >=100000 — AB

## 2022-04-03 DIAGNOSIS — D225 Melanocytic nevi of trunk: Secondary | ICD-10-CM | POA: Diagnosis not present

## 2022-04-03 DIAGNOSIS — L821 Other seborrheic keratosis: Secondary | ICD-10-CM | POA: Diagnosis not present

## 2022-04-15 ENCOUNTER — Encounter: Payer: Self-pay | Admitting: Family Medicine

## 2022-06-12 DIAGNOSIS — Z800 Family history of malignant neoplasm of digestive organs: Secondary | ICD-10-CM | POA: Diagnosis not present

## 2022-06-12 DIAGNOSIS — Z803 Family history of malignant neoplasm of breast: Secondary | ICD-10-CM | POA: Diagnosis not present

## 2022-06-12 DIAGNOSIS — Z801 Family history of malignant neoplasm of trachea, bronchus and lung: Secondary | ICD-10-CM | POA: Diagnosis not present

## 2022-06-12 DIAGNOSIS — Z01419 Encounter for gynecological examination (general) (routine) without abnormal findings: Secondary | ICD-10-CM | POA: Diagnosis not present

## 2022-07-17 DIAGNOSIS — E237 Disorder of pituitary gland, unspecified: Secondary | ICD-10-CM | POA: Diagnosis not present

## 2022-08-04 ENCOUNTER — Other Ambulatory Visit: Payer: Self-pay | Admitting: Internal Medicine

## 2022-08-04 DIAGNOSIS — D352 Benign neoplasm of pituitary gland: Secondary | ICD-10-CM

## 2022-08-25 ENCOUNTER — Other Ambulatory Visit: Payer: Self-pay | Admitting: Obstetrics

## 2022-08-25 ENCOUNTER — Telehealth: Payer: Self-pay | Admitting: Family Medicine

## 2022-08-25 ENCOUNTER — Telehealth: Payer: BC Managed Care – PPO | Admitting: Physician Assistant

## 2022-08-25 DIAGNOSIS — B9689 Other specified bacterial agents as the cause of diseases classified elsewhere: Secondary | ICD-10-CM | POA: Diagnosis not present

## 2022-08-25 DIAGNOSIS — Z131 Encounter for screening for diabetes mellitus: Secondary | ICD-10-CM

## 2022-08-25 DIAGNOSIS — Z79899 Other long term (current) drug therapy: Secondary | ICD-10-CM

## 2022-08-25 DIAGNOSIS — Z13220 Encounter for screening for lipoid disorders: Secondary | ICD-10-CM

## 2022-08-25 DIAGNOSIS — Z1231 Encounter for screening mammogram for malignant neoplasm of breast: Secondary | ICD-10-CM

## 2022-08-25 DIAGNOSIS — J019 Acute sinusitis, unspecified: Secondary | ICD-10-CM

## 2022-08-25 DIAGNOSIS — Z130 Encounter for screening for diseases of the blood and blood-forming organs and certain disorders involving the immune mechanism: Secondary | ICD-10-CM

## 2022-08-25 MED ORDER — AMOXICILLIN-POT CLAVULANATE 875-125 MG PO TABS: 1.0000 | ORAL_TABLET | Freq: Two times a day (BID) | ORAL | 0 refills | Status: DC

## 2022-08-25 NOTE — Telephone Encounter (Signed)
Blood work ordered in EPIC. Patient notified. 

## 2022-08-25 NOTE — Telephone Encounter (Signed)
Lipid, liver, metabolic 7, [facility] Wellness Screening hyperlipidemia

## 2022-08-25 NOTE — Progress Notes (Signed)

## 2022-08-25 NOTE — Telephone Encounter (Signed)
Patient has physical on 2/12 and needing labs done

## 2022-08-30 ENCOUNTER — Encounter: Payer: Self-pay | Admitting: Internal Medicine

## 2022-08-30 ENCOUNTER — Ambulatory Visit: Payer: BC Managed Care – PPO | Admitting: Internal Medicine

## 2022-08-30 VITALS — BP 116/74 | HR 78 | Ht 61.25 in | Wt 120.6 lb

## 2022-08-30 DIAGNOSIS — D352 Benign neoplasm of pituitary gland: Secondary | ICD-10-CM

## 2022-08-30 DIAGNOSIS — E221 Hyperprolactinemia: Secondary | ICD-10-CM | POA: Diagnosis not present

## 2022-08-30 LAB — COMPREHENSIVE METABOLIC PANEL
ALT: 16 U/L (ref 0–35)
AST: 17 U/L (ref 0–37)
Albumin: 4.6 g/dL (ref 3.5–5.2)
Alkaline Phosphatase: 35 U/L — ABNORMAL LOW (ref 39–117)
BUN: 11 mg/dL (ref 6–23)
CO2: 28 mEq/L (ref 19–32)
Calcium: 9.7 mg/dL (ref 8.4–10.5)
Chloride: 102 mEq/L (ref 96–112)
Creatinine, Ser: 0.62 mg/dL (ref 0.40–1.20)
GFR: 109.46 mL/min (ref >60.00)
Glucose, Bld: 110 mg/dL — ABNORMAL HIGH (ref 70–99)
Potassium: 3.8 mEq/L (ref 3.5–5.1)
Sodium: 141 mEq/L (ref 135–145)
Total Bilirubin: 0.5 mg/dL (ref 0.2–1.2)
Total Protein: 8.1 g/dL (ref 6.0–8.3)

## 2022-08-30 LAB — TSH: TSH: 0.57 u[IU]/mL (ref 0.35–5.50)

## 2022-08-30 LAB — T4, FREE: Free T4: 0.78 ng/dL (ref 0.60–1.60)

## 2022-08-30 NOTE — Progress Notes (Unsigned)
Name: Briana Valdez  MRN/ DOB: [id number], 03-07-1981    Age/ Sex: 42 y.o., female     PCP: Kathyrn Drown, MD   Reason for Endocrinology Evaluation: Pituitary adenoma     Initial Endocrinology Clinic Visit: 04/04/2021    PATIENT IDENTIFIER: Briana Valdez is a 42 y.o., female with a past medical history of pituitary adenoma. She has followed with [facility] since 04/04/2021 for consultative assistance with management of her pituitary adenoma.   HISTORICAL SUMMARY: The patient was first diagnosed with pituitary adenoma in 2018.  She has slight elevation of prolactin in the past as well as normal ophthalmological exam  She has left intermittent galactorrhea Prolactin level was 27.8 NG/mL in June 2022, patient was started on cabergoline She uses an IUD and rarely gets any menstruations  Has 3 biological boys (26,17/15)  SUBJECTIVE:    Today (08/30/2022):  [name] is here for pituitary adenoma and prolactinoma.  Denies nausea vomiting or constipation  Has occasional headaches for the past 2 weeks , attributes to sinus and stress  Denies visual changes, last eye exam 07/17/2022 - normal exam  Denies galactorrhea  Continues with IUD use    Cabergoline 0.5 mg , half a tab twice weekly   HISTORY:  Past Medical History:  Past Medical History:  Diagnosis Date   Medical history non-contributory    Past Surgical History:  Past Surgical History:  Procedure Laterality Date   COLONOSCOPY WITH PROPOFOL N/A 10/18/2020   Non-bleeding internal hemorrhoids, otherwise normal.    PLACEMENT OF BREAST IMPLANTS     Social History:  reports that she has never smoked. She has never used smokeless tobacco. She reports that she does not drink alcohol and does not use drugs. Family History:  Family History  Problem Relation Age of Onset   Cancer Mother        breast   Diabetes Father    Hypertension Father    Colon cancer Paternal Grandmother    Colon polyps  Neg Hx    Other Neg Hx      HOME MEDICATIONS: Allergies as of 08/30/2022   No Known Allergies      Medication List        Accurate as of August 30, 2022 12:33 PM. If you have any questions, ask your nurse or doctor.          amoxicillin-clavulanate 875-125 MG tablet Commonly known as: AUGMENTIN Take 1 tablet by mouth 2 (two) times daily.   cabergoline 0.5 MG tablet Commonly known as: DOSTINEX TAKE 1/2 TABLET(0.25 MG) BY MOUTH 2 TIMES A WEEK   ibuprofen 200 MG tablet Commonly known as: ADVIL Take 400 mg by mouth every 6 (six) hours as needed for mild pain or moderate pain.   sodium chloride 0.65 % Soln nasal spray Commonly known as: OCEAN Place 1 spray into both nostrils daily as needed for congestion.   STOOL SOFTENER PO Take 2 tablets by mouth daily.          OBJECTIVE:   PHYSICAL EXAM: VS: BP 116/74 (BP Location: Left Arm, Patient Position: Sitting, Cuff Size: Small)   Pulse 78   Ht 5' 1.25" (1.556 m)   Wt 120 lb 9.6 oz (54.7 kg)   SpO2 98%   BMI 22.60 kg/m    EXAM: General: Pt appears well and is in NAD  Eyes: External eye exam normal without stare, lid lag or exophthalmos.  EOM intact.    Neck: General:  Supple without adenopathy. Thyroid: Thyroid size normal.  No goiter or nodules appreciated. No thyroid bruit.  Lungs: Clear with good BS bilat with no rales, rhonchi, or wheezes  Heart: Auscultation: RRR.  Abdomen: Normoactive bowel sounds, soft, nontender, without masses or organomegaly palpable  Extremities:  BL LE: No pretibial edema normal ROM and strength.  Mental Status: Judgment, insight: Intact Orientation: Oriented to time, place, and person Mood and affect: No depression, anxiety, or agitation     DATA REVIEWED: ***  MRI Brain 12/15/2020 Narrative & Impression  CLINICAL DATA:  Enlarged pituitary gland   EXAM: MRI HEAD WITHOUT AND WITH CONTRAST   TECHNIQUE: Multiplanar, multiecho pulse sequences of the brain and  surrounding structures were obtained without and with intravenous contrast.   CONTRAST:  33m GADAVIST GADOBUTROL 1 MMOL/ML IV SOLN   COMPARISON:  MR head without contrast 11/23/2020   FINDINGS: Brain: No acute infarct, hemorrhage, or mass lesion is present. A minimal subcortical T2 hyperintensities are stable. Hemorrhage or mass lesion is present. The ventricles are of normal size. The internal auditory canals are within normal limits. The brainstem and cerebellum are within normal limits.   Dedicated imaging of the sella demonstrates a hypointense area on the postcontrast images along the left side of the gland measuring 9 x 8 x 5 mm.   The pituitary stalk is shifted the right. Pituitary gland extends to the appear aspect of chiasm without mass effect. There is no extension into the cavernous sinus. Optic nerves and tracts are within limits.   Postcontrast imaging through the remainder of the brain is unremarkable. No present.   Vascular: Flow is present in the major intracranial arteries.   Skull and upper cervical spine: The craniocervical junction is normal. Upper cervical spine is within normal limits. Marrow signal is unremarkable.   Sinuses/Orbits: The paranasal sinuses and mastoid air cells are clear. The globes and orbits are within normal limits.   IMPRESSION: 1. 9 x 8 x 5 mm area of hypointense signal along the left side of the sella compatible with a pituitary microadenoma. 2. No extension into the cavernous sinus. 3. Stable minimal subcortical T2 hyperintensities bilaterally. The finding is nonspecific but can be seen in the setting of chronic microvascular ischemia, a demyelinating process such as multiple sclerosis, vasculitis, complicated migraine headaches, or as the sequelae of a prior infectious or inflammatory process.        Old records , labs and images have been reviewed.    ASSESSMENT / PLAN / RECOMMENDATIONS:   Pituitary Microadenoma:  -  Brain MRI showed pituitary adenoma 9 x8x5 mm in 2022 -We will proceed with repeat MRI for this year -No local symptoms -Up-to-date on eye exam, emphasized the importance of having visual field testing periodically    2.  Hyperprolactinemia:  -Prolactin has been normal since being on cabergoline   Medications  Cabergoline 0.5 mg, half a tablet twice weekly   Follow-up in 6 months   Signed electronically by: AMack Guise MD  [facility]  [address], [place] [address]Phone: [phone]: [phone]     CC: LKathyrn Drown [place]Phone: [phone] Fax: [phone]  Return to Endocrinology clinic as below: Future Appointments  Date Time Provider DLewes 10/02/2022  1:30 PM LKathyrn Drown MD [facility] 10/16/2022  2:30 PM [facility] MM 2 [facility]  03/12/2023 10:10  AM Eziah Negro, Melanie Crazier, MD [facility] None

## 2022-08-31 LAB — PROLACTIN: Prolactin: 6.4 ng/mL

## 2022-08-31 MED ORDER — CABERGOLINE 0.5 MG PO TABS: 0.2500 mg | ORAL_TABLET | ORAL | 2 refills | Status: DC

## 2022-09-04 ENCOUNTER — Ambulatory Visit
Admission: RE | Admit: 2022-09-04 | Discharge: 2022-09-04 | Disposition: A | Discharge status: Home / Self Care | Payer: BC Managed Care – PPO | Source: Ambulatory Visit | Attending: Nurse Practitioner | Admitting: Nurse Practitioner

## 2022-09-04 VITALS — BP 149/85 | HR 109 | Temp 100.6°F | Resp 16

## 2022-09-04 DIAGNOSIS — R6889 Other general symptoms and signs: Secondary | ICD-10-CM | POA: Diagnosis not present

## 2022-09-04 LAB — POCT RAPID STREP A (OFFICE): Rapid Strep A Screen: NEGATIVE

## 2022-09-04 MED ORDER — ACETAMINOPHEN 500 MG PO TABS
1000.0000 mg | ORAL_TABLET | Freq: Once | ORAL | Status: AC
  Administered 2022-09-04: 1000 mg via ORAL

## 2022-09-04 MED ORDER — BENZONATATE 100 MG PO CAPS: 100.0000 mg | ORAL_CAPSULE | Freq: Three times a day (TID) | ORAL | 0 refills | Status: DC | PRN

## 2022-09-04 NOTE — ED Provider Notes (Signed)
[facility]    CSN: [id number] Arrival date & time: 09/04/22  1147      History   Chief Complaint Chief Complaint  Patient presents with   Fever    Sudden onset yesterday afternoon Extreme sore throat randomly And a ton of sinus drainage Which caused a dry coughFever starting about 2am100.9 on forehead Just finished an antibiotic for acute sinusitis - Entered by patient   Appointment    1200   Headache    HPI Briana Valdez is a 42 y.o. female.   Patient presents today for 1 day history of fever, dry hacking cough, runny nose and postnasal drainage, sore throat, headache, bilateral jaw and ear pain, decreased appetite, and fatigue.  She denies shortness of breath, chest pain, chest or nasal congestion, abdominal pain, nausea/vomiting, diarrhea.  Reports 3 days ago, she completed prescription of Augmentin for sinusitis.  Reports she is feeling normal Saturday and Sunday and symptoms started abruptly last night.  No known sick contacts.  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  Has been drinking warm tea with honey, taking Benadryl, water, and ibuprofen for symptoms with minimal benefit.    Past Medical History:  Diagnosis Date   Medical history non-contributory     Patient Active Problem List   Diagnosis Date Noted   Hyperprolactinemia ([place]) 09/26/2021   Pituitary adenoma ([place]) 04/05/2021   Grade I hemorrhoids 01/05/2021   New onset headache 11/22/2020   Rectal bleeding 07/28/2020    Past Surgical History:  Procedure Laterality Date   COLONOSCOPY WITH PROPOFOL N/A 10/18/2020   Non-bleeding internal hemorrhoids, otherwise normal.    PLACEMENT OF BREAST IMPLANTS      OB History   No obstetric history on file.      Home Medications    Prior to Admission medications   Medication Sig Start Date End Date Taking? Authorizing Provider  benzonatate (TESSALON) 100 MG capsule Take 1 capsule (100 mg total) by mouth 3 (three) times  daily as needed for cough. Do not take with alcohol or while driving or operating heavy machinery.  May cause drowsiness. 09/04/22  Yes Eulogio Bear, NP  cabergoline (DOSTINEX) 0.5 MG tablet Take 0.5 tablets (0.25 mg total) by mouth once a week. 08/31/22   Shamleffer, Melanie Crazier, MD  Docusate Calcium (STOOL SOFTENER PO) Take 2 tablets by mouth daily.    [provider]  ibuprofen (ADVIL) 200 MG tablet Take 400 mg by mouth every 6 (six) hours as needed for mild pain or moderate pain.    [provider]  sodium chloride (OCEAN) 0.65 % SOLN nasal spray Place 1 spray into both nostrils daily as needed for congestion.    [provider]    Family History Family History  Problem Relation Age of Onset   Cancer Mother        breast   Diabetes Father    Hypertension Father    Colon cancer Paternal Grandmother    Colon polyps Neg Hx    Other Neg Hx     Social History Social History   Tobacco Use   Smoking status: Never   Smokeless tobacco: Never  Vaping Use   Vaping Use: Never used  Substance Use Topics   Alcohol use: No   Drug use: No     Allergies   Tamiflu [oseltamivir]   Review of Systems Review of Systems Per HPI  Physical Exam Triage Vital Signs ED Triage Vitals  Enc  Vitals Group     BP 09/04/22 1214 (!) 149/85     Pulse Rate 09/04/22 1214 (!) 109     Resp 09/04/22 1214 16     Temp 09/04/22 1214 (!) 100.6 F (38.1 C)     Temp Source 09/04/22 1214 Oral     SpO2 09/04/22 1214 96 %     Weight --      Height --      Head Circumference --      Peak Flow --      Pain Score 09/04/22 1212 9     Pain Loc --      Pain Edu? --      Excl. in [place]? --    No data found.  Updated Vital Signs BP (!) 149/85 (BP Location: Right Arm)   Pulse (!) 109   Temp (!) 100.6 F (38.1 C) (Oral)   Resp 16   SpO2 96%   Visual Acuity Right Eye Distance:   Left Eye Distance:   Bilateral Distance:    Right Eye Near:   Left Eye Near:     Bilateral Near:     Physical Exam Vitals and nursing note reviewed.  Constitutional:      General: She is not in acute distress.    Appearance: Normal appearance. She is ill-appearing. She is not toxic-appearing.  HENT:     Head: Normocephalic and atraumatic.     Right Ear: Tympanic membrane, ear canal and external ear normal.     Left Ear: Tympanic membrane, ear canal and external ear normal.     Nose: Rhinorrhea present. No congestion.     Mouth/Throat:     Mouth: Mucous membranes are moist.     Pharynx: Oropharynx is clear. Posterior oropharyngeal erythema present. No oropharyngeal exudate.  Eyes:     General: No scleral icterus.    Extraocular Movements: Extraocular movements intact.  Cardiovascular:     Rate and Rhythm: Normal rate and regular rhythm.  Pulmonary:     Effort: Pulmonary effort is normal. No respiratory distress.     Breath sounds: Normal breath sounds. No wheezing, rhonchi or rales.  Abdominal:     General: Abdomen is flat. Bowel sounds are normal. There is no distension.     Palpations: Abdomen is soft.  Musculoskeletal:     Cervical back: Normal range of motion and neck supple.  Lymphadenopathy:     Cervical: Cervical adenopathy present.  Skin:    General: Skin is warm and dry.     Coloration: Skin is not jaundiced or pale.     Findings: No erythema or rash.  Neurological:     Mental Status: She is alert and oriented to person, place, and time.  Psychiatric:        Behavior: Behavior is cooperative.      UC Treatments / Results  Labs (all labs ordered are listed, but only abnormal results are displayed) Labs Reviewed  POCT RAPID STREP A (OFFICE)    EKG   Radiology No results found.  Procedures Procedures (including critical care time)  Medications Ordered in UC Medications  acetaminophen (TYLENOL) tablet 1,000 mg (1,000 mg Oral Given 09/04/22 1217)    Initial Impression / Assessment and Plan / UC Course  I have reviewed the triage  vital signs and the nursing notes.  Pertinent labs & imaging results that were available during my care of the patient were reviewed by me and considered in my medical decision making (see chart for  details).   Patient is sick-appearing, normotensive, not tachypneic, oxygenating well on room air.  She has a low-grade fever and is slightly tachycardic today, likely secondary to acute illness.  Flu-like symptoms I am suspicious for influenza given her symptoms Unfortunately, she is allergic to Tamiflu so will not prescribe today Supportive care discussed Start Tessalon Perles as needed Recommended staying home until fever free for 24 hours without fever reducing medication ER and return precautions discussed with patient  The patient was given the opportunity to ask questions.  All questions answered to their satisfaction.  The patient is in agreement to this plan.    Final Clinical Impressions(s) / UC Diagnoses   Final diagnoses:  Flu-like symptoms     Discharge Instructions      I think you may have the flu.  Unfortunately, we do not have testing material to test you for it today.  Symptoms should improve toward the end of this week.  If you develop chest pain or shortness of breath, go to the emergency room.  Some things that can make you feel better are: - Increased rest - Increasing fluid with water/sugar free electrolytes - Acetaminophen and ibuprofen as needed for fever/pain - Salt water gargling, chloraseptic spray and throat lozenges for sore throat - OTC guaifenesin (Mucinex) 600 mg twice daily for congestion - Saline sinus flushes or a neti pot for congestion - Humidifying the air -Tessalon Perles every 8 hours as needed for dry cough      ED Prescriptions     Medication Sig Dispense Auth. Provider   benzonatate (TESSALON) 100 MG capsule Take 1 capsule (100 mg total) by mouth 3 (three) times daily as needed for cough. Do not take with alcohol or while driving or  operating heavy machinery.  May cause drowsiness. 21 capsule Eulogio Bear, NP      PDMP not reviewed this encounter.   Eulogio Bear, NP 09/04/22 1308

## 2022-09-04 NOTE — Discharge Instructions (Addendum)
I think you may have the flu.  Unfortunately, we do not have testing material to test you for it today.  Symptoms should improve toward the end of this week.  If you develop chest pain or shortness of breath, go to the emergency room.  Some things that can make you feel better are: - Increased rest - Increasing fluid with water/sugar free electrolytes - Acetaminophen and ibuprofen as needed for fever/pain - Salt water gargling, chloraseptic spray and throat lozenges for sore throat - OTC guaifenesin (Mucinex) 600 mg twice daily for congestion - Saline sinus flushes or a neti pot for congestion - Humidifying the air -Tessalon Perles every 8 hours as needed for dry cough

## 2022-09-04 NOTE — ED Triage Notes (Signed)
Pt reports  sore throat, dental pain, ear pain, sinus drainage, dry cough, fever 100.9 F x 1 day.  Reports  finished an antibiotic for acute sinusitis 3 days ago.

## 2022-09-05 ENCOUNTER — Encounter: Payer: Self-pay | Admitting: Family Medicine

## 2022-09-05 DIAGNOSIS — J019 Acute sinusitis, unspecified: Secondary | ICD-10-CM

## 2022-09-07 ENCOUNTER — Ambulatory Visit (INDEPENDENT_AMBULATORY_CARE_PROVIDER_SITE_OTHER): Payer: BC Managed Care – PPO | Admitting: Family Medicine

## 2022-09-07 ENCOUNTER — Encounter: Payer: Self-pay | Admitting: Family Medicine

## 2022-09-07 VITALS — BP 118/80 | HR 75 | Temp 99.2°F | Wt 117.0 lb

## 2022-09-07 DIAGNOSIS — R6889 Other general symptoms and signs: Secondary | ICD-10-CM

## 2022-09-07 NOTE — Progress Notes (Signed)
   Subjective:    Patient ID: Briana Valdez, female    DOB: 08-Jan-1981, 42 y.o.   MRN: [id number]  HPI  Patient presents today with respiratory illness Number of days present-09/04/22  Symptoms include- fever, sore throat at random times, ear pain at random times, cough, fatigue, facial pain  Presence of worrisome signs (severe shortness of breath, lethargy, etc.) - none  Recent/current visit to urgent care or ER- urgent care on 09/04/22  Recent direct exposure to Covid- no  Any current Covid testing- no   She denies Short of breath denies sweats today denies night sweats.  Has had some low-grade fever the past couple days after having severe fever body aches and headache over the weekend Review of Systems     Objective:   Physical Exam Gen-NAD not toxic TMS-normal bilateral T- normal no redness Chest-CTA respiratory rate normal no crackles CV RRR no murmur Skin-warm dry Neuro-grossly normal Not respiratory distress no sinus tenderness lungs sound very clear       Assessment & Plan:  Viral syndrome Post flu At this point we will be academic to do any additional testing for viruses I do not feel that she has secondary infection currently she was encouraged to give this a few more days to see if it would improve if things get worse such as increased coughing fevers chills shortness of breath immediately notify us we will need to recheck or if getting worse.  Hold off on any antibiotics currently

## 2022-09-07 NOTE — Telephone Encounter (Signed)
Nurses Please touch base with Briana Valdez She has some options It does appear that her symptoms are improving and statistically speaking she should gradually see some improvement over the next several days It can sometimes take up to a couple weeks to get all of a person's energy back after the flu But if she is not dramatically better by Monday she should connect with [place] and I could work her into the schedule to see her  If she is worried today and would feel better about being seen this afternoon I could see her at 4:30 PM  Once again if she would like to give this a few more days to turn the corner and get well she could Certainly if her symptoms symptoms are getting worse as we had into Monday or are not adequately turning the corner we could check her at that time  She will need to let me know which path she would prefer to do thank you

## 2022-09-11 ENCOUNTER — Other Ambulatory Visit: Payer: Self-pay

## 2022-09-11 MED ORDER — AMOXICILLIN 500 MG PO CAPS: 500.0000 mg | ORAL_CAPSULE | Freq: Three times a day (TID) | ORAL | 0 refills | Status: DC

## 2022-09-11 NOTE — Telephone Encounter (Signed)
Spoke with patient per Dr Nicki Reaper advised I appreciate Nilsa's update   It does sound that she is getting better but at the same time it does sound that there is a component of sinus going on here.   Amoxicillin 500 mg 1 taken 3 times daily for the next 7 days would be reasonable.  Please send in this prescription   Please also send this message to [place] as well   Should she have further troubles or problems let us know thank you-Dr. Nicki Reaper  Patient verbalized understanding and medication sent to pharmacy.Marland Kitchen

## 2022-09-11 NOTE — Telephone Encounter (Signed)
Nurses I appreciate Morene's update  It does sound that she is getting better but at the same time it does sound that there is a component of sinus going on here.  Amoxicillin 500 mg 1 taken 3 times daily for the next 7 days would be reasonable.  Please send in this prescription  Please also send this message to [place] as well  Should she have further troubles or problems let us know thank you-Dr. Nicki Reaper

## 2022-09-11 NOTE — Addendum Note (Signed)
Addended by: Orvan Seen on: 09/11/2022 04:36 PM   Modules accepted: Orders

## 2022-09-25 ENCOUNTER — Encounter: Payer: BC Managed Care – PPO | Admitting: Family Medicine

## 2022-09-25 DIAGNOSIS — Z130 Encounter for screening for diseases of the blood and blood-forming organs and certain disorders involving the immune mechanism: Secondary | ICD-10-CM | POA: Diagnosis not present

## 2022-09-25 DIAGNOSIS — Z79899 Other long term (current) drug therapy: Secondary | ICD-10-CM | POA: Diagnosis not present

## 2022-09-25 DIAGNOSIS — Z13220 Encounter for screening for lipoid disorders: Secondary | ICD-10-CM | POA: Diagnosis not present

## 2022-09-25 DIAGNOSIS — Z131 Encounter for screening for diabetes mellitus: Secondary | ICD-10-CM | POA: Diagnosis not present

## 2022-09-26 LAB — HEPATIC FUNCTION PANEL
ALT: 14 IU/L (ref 0–32)
AST: 12 IU/L (ref 0–40)
Albumin: 4.4 g/dL (ref 3.9–4.9)
Alkaline Phosphatase: 42 IU/L — ABNORMAL LOW (ref 44–121)
Bilirubin Total: 0.6 mg/dL (ref 0.0–1.2)
Bilirubin, Direct: 0.15 mg/dL (ref 0.00–0.40)
Total Protein: 7.6 g/dL (ref 6.0–8.5)

## 2022-09-26 LAB — CBC WITH DIFFERENTIAL/PLATELET
Basophils Absolute: 0 10*3/uL (ref 0.0–0.2)
Basos: 1 %
EOS (ABSOLUTE): 0.1 10*3/uL (ref 0.0–0.4)
Eos: 2 %
Hematocrit: 42 % (ref 34.0–46.6)
Hemoglobin: 14.3 g/dL (ref 11.1–15.9)
Immature Grans (Abs): 0 10*3/uL (ref 0.0–0.1)
Immature Granulocytes: 1 %
Lymphocytes Absolute: 1.6 10*3/uL (ref 0.7–3.1)
Lymphs: 32 %
MCH: 32.1 pg (ref 26.6–33.0)
MCHC: 34 g/dL (ref 31.5–35.7)
MCV: 94 fL (ref 79–97)
Monocytes Absolute: 0.6 10*3/uL (ref 0.1–0.9)
Monocytes: 11 %
Neutrophils Absolute: 2.8 10*3/uL (ref 1.4–7.0)
Neutrophils: 53 %
Platelets: 271 10*3/uL (ref 150–450)
RBC: 4.46 x10E6/uL (ref 3.77–5.28)
RDW: 11.2 % — ABNORMAL LOW (ref 11.7–15.4)
WBC: 5.2 10*3/uL (ref 3.4–10.8)

## 2022-09-26 LAB — LIPID PANEL
Chol/HDL Ratio: 2.1 ratio (ref 0.0–4.4)
Cholesterol, Total: 164 mg/dL (ref 100–199)
HDL: 77 mg/dL (ref >39)
LDL Chol Calc (NIH): 74 mg/dL (ref 0–99)
Triglycerides: 66 mg/dL (ref 0–149)
VLDL Cholesterol Cal: 13 mg/dL (ref 5–40)

## 2022-09-26 LAB — BASIC METABOLIC PANEL
BUN/Creatinine Ratio: 15 (ref 9–23)
BUN: 9 mg/dL (ref 6–24)
CO2: 21 mmol/L (ref 20–29)
Calcium: 9.5 mg/dL (ref 8.7–10.2)
Chloride: 102 mmol/L (ref 96–106)
Creatinine, Ser: 0.59 mg/dL (ref 0.57–1.00)
Glucose: 83 mg/dL (ref 70–99)
Potassium: 4.2 mmol/L (ref 3.5–5.2)
Sodium: 141 mmol/L (ref 134–144)
eGFR: 115 mL/min/{1.73_m2} (ref >59)

## 2022-10-02 ENCOUNTER — Ambulatory Visit (INDEPENDENT_AMBULATORY_CARE_PROVIDER_SITE_OTHER): Payer: BC Managed Care – PPO | Admitting: Family Medicine

## 2022-10-02 VITALS — BP 110/68 | HR 85 | Ht 61.0 in | Wt 113.8 lb

## 2022-10-02 DIAGNOSIS — Z0000 Encounter for general adult medical examination without abnormal findings: Secondary | ICD-10-CM | POA: Diagnosis not present

## 2022-10-02 NOTE — Progress Notes (Signed)
   Subjective:    Patient ID: Briana Valdez, female    DOB: 02/20/1981, 42 y.o.   MRN: [id number]  HPI The patient comes in today for a wellness visit.    A review of their health history was completed.  A review of medications was also completed.  Any needed refills; none  Eating habits: Relatively good eating habits  Falls/  MVA accidents in past few months: No accidents or injuries recently  Regular exercise: Stays very busy with work does a lot of walking  Specialist pt sees on regular basis: endocrinologist, optomologist, gynecologists   Preventative health issues were discussed.    Additional concerns: To some degree does have anxiety related issues as well as excessive worry denies being depressed    Review of Systems     Objective:   Physical Exam General-in no acute distress Eyes-no discharge Lungs-respiratory rate normal, CTA CV-no murmurs,RRR Extremities skin warm dry no edema Neuro grossly normal Behavior normal, alert  Patient had a colonoscopy in 2022 normal results no need to repeat for 10 years Up-to-date on mammogram on a regular basis Patient does not want to be on medications but we did discuss various self-help ways of dealing with excessive stress.  Offered counseling and medication patient currently defers     Assessment & Plan:  Adult wellness-complete.wellness physical was conducted today. Importance of diet and exercise were discussed in detail.  Importance of stress reduction and healthy living were discussed.  In addition to this a discussion regarding safety was also covered.  We also reviewed over immunizations and gave recommendations regarding current immunization needed for age.   In addition to this additional areas were also touched on including: Preventative health exams needed:  Colonoscopy 2032  Patient was advised yearly wellness exam

## 2022-10-03 ENCOUNTER — Encounter: Payer: Self-pay | Admitting: Internal Medicine

## 2022-10-04 ENCOUNTER — Ambulatory Visit
Admission: RE | Admit: 2022-10-04 | Discharge: 2022-10-04 | Disposition: A | Discharge status: Home / Self Care | Payer: BC Managed Care – PPO | Source: Ambulatory Visit | Attending: Internal Medicine | Admitting: Internal Medicine

## 2022-10-04 DIAGNOSIS — E221 Hyperprolactinemia: Secondary | ICD-10-CM

## 2022-10-04 MED ORDER — GADOPICLENOL 0.5 MMOL/ML IV SOLN
5.0000 mL | Freq: Once | INTRAVENOUS | Status: AC | PRN
  Administered 2022-10-04: 5 mL via INTRAVENOUS

## 2022-10-16 ENCOUNTER — Other Ambulatory Visit: Payer: Self-pay

## 2022-10-16 ENCOUNTER — Encounter: Payer: Self-pay | Admitting: Family Medicine

## 2022-10-16 ENCOUNTER — Ambulatory Visit
Admission: RE | Admit: 2022-10-16 | Discharge: 2022-10-16 | Disposition: A | Discharge status: Home / Self Care | Payer: BC Managed Care – PPO | Source: Ambulatory Visit | Attending: Obstetrics | Admitting: Obstetrics

## 2022-10-16 DIAGNOSIS — Z1231 Encounter for screening mammogram for malignant neoplasm of breast: Secondary | ICD-10-CM

## 2022-10-16 MED ORDER — DOXYCYCLINE HYCLATE 100 MG PO TABS: 100.0000 mg | ORAL_TABLET | Freq: Two times a day (BID) | ORAL | 0 refills | Status: AC

## 2022-10-16 NOTE — Telephone Encounter (Signed)
Nurses I would recommend doxycycline 1 tablet twice daily with a snack and a tall glass of water for 7 days Also recommend OTC Flonase for the next 7 days and Claritin OTC if not improving recommend follow-up office visit

## 2022-10-21 ENCOUNTER — Encounter: Payer: Self-pay | Admitting: Family Medicine

## 2022-10-23 ENCOUNTER — Telehealth: Payer: Self-pay | Admitting: Family Medicine

## 2022-10-23 NOTE — Telephone Encounter (Signed)
Will give phone call to the patient to discuss clinical options

## 2022-10-25 ENCOUNTER — Encounter: Payer: Self-pay | Admitting: Family Medicine

## 2022-10-25 ENCOUNTER — Other Ambulatory Visit: Payer: Self-pay | Admitting: Family Medicine

## 2022-10-25 MED ORDER — GABAPENTIN 100 MG PO CAPS: 100.0000 mg | ORAL_CAPSULE | Freq: Every day | ORAL | 1 refills | Status: DC

## 2022-10-25 NOTE — Telephone Encounter (Signed)
I already sent in gabapentin and discussed the case with her.  Please see telephone message.  No further documentation is necessary on this.  Patient is aware.-FYI

## 2022-10-25 NOTE — Telephone Encounter (Signed)
Long discussion with the patient regarding what is going on with her.  She is having a lot of anxiousness as exhibited by excessive worry or concern about what things going on.  This is pervasive throughout the day worse in the evening.  She is clenching her jaw a lot.  She denies being depressed.  It should be noted that when we did a drug analysis [place] conflicts with the medication that the endocrinologist has her on-because of this we would recommend utilizing gabapentin 100 mg each evening.  Side effects discussed.  If it causes too much drowsiness stop the medicine.  Also recommend patient to do a follow-up MyChart visit in 2 weeks time and keep her standard follow-up visit in April

## 2022-12-04 ENCOUNTER — Ambulatory Visit: Payer: BC Managed Care – PPO | Admitting: Family Medicine

## 2022-12-04 VITALS — BP 123/81 | HR 75 | Ht 61.0 in | Wt 119.6 lb

## 2022-12-04 DIAGNOSIS — M542 Cervicalgia: Secondary | ICD-10-CM | POA: Diagnosis not present

## 2022-12-04 DIAGNOSIS — F439 Reaction to severe stress, unspecified: Secondary | ICD-10-CM

## 2022-12-04 MED ORDER — GABAPENTIN 100 MG PO CAPS: ORAL_CAPSULE | ORAL | 4 refills | Status: DC

## 2022-12-04 NOTE — Progress Notes (Addendum)
   Subjective:    Patient ID: Briana Valdez, female    DOB: 1981-04-29, 42 y.o.   MRN: [id number]  HPI This verifies that the history review of any tests, physical exam, assessment and plan were conducted by Dr. Lilyan Punt and documented accordingly by him today Lilyan Punt MD primary care [facility]  Patient arrives discuss medication.  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 Cervical pain (neck) Increase gabapentin Hold off on imaging Recheck 4 weeks No need for MRI currently  2. Stress Increase gabapentin Patient denies being depressed Patient to give [place] feedback within the next 3 to 4 weeks  Follow-up office visit within 4 to 5 weeks

## 2022-12-11 ENCOUNTER — Encounter: Payer: Self-pay | Admitting: Family Medicine

## 2022-12-11 DIAGNOSIS — G542 Cervical root disorders, not elsewhere classified: Secondary | ICD-10-CM

## 2022-12-11 DIAGNOSIS — S134XXA Sprain of ligaments of cervical spine, initial encounter: Secondary | ICD-10-CM | POA: Diagnosis not present

## 2022-12-11 DIAGNOSIS — M26601 Right temporomandibular joint disorder, unspecified: Secondary | ICD-10-CM | POA: Diagnosis not present

## 2022-12-11 NOTE — Telephone Encounter (Signed)
Nurses-please go ahead and set up MRI cervical spine due to cervical nerve impingement with numbness in the hand and neck pain  (Unfortunately not responding fully to gabapentin.  It should be noted that some insurance companies require that a patient try conservative management for up to 12 weeks under doctor's care before they will approve MRI.  Please forward this message to [place] so she is aware that if this gets denied we may have to wait for a period of time before we can get this approved)  Thanks-Dr. Lorin Picket

## 2022-12-13 DIAGNOSIS — M26601 Right temporomandibular joint disorder, unspecified: Secondary | ICD-10-CM | POA: Diagnosis not present

## 2022-12-13 DIAGNOSIS — S134XXA Sprain of ligaments of cervical spine, initial encounter: Secondary | ICD-10-CM | POA: Diagnosis not present

## 2022-12-20 DIAGNOSIS — M5010 Cervical disc disorder with radiculopathy, unspecified cervical region: Secondary | ICD-10-CM | POA: Diagnosis not present

## 2022-12-28 DIAGNOSIS — M542 Cervicalgia: Secondary | ICD-10-CM | POA: Diagnosis not present

## 2023-01-01 DIAGNOSIS — M5092 Unspecified cervical disc disorder, mid-cervical region, unspecified level: Secondary | ICD-10-CM | POA: Diagnosis not present

## 2023-01-05 DIAGNOSIS — M5092 Unspecified cervical disc disorder, mid-cervical region, unspecified level: Secondary | ICD-10-CM | POA: Diagnosis not present

## 2023-01-08 DIAGNOSIS — M5092 Unspecified cervical disc disorder, mid-cervical region, unspecified level: Secondary | ICD-10-CM | POA: Diagnosis not present

## 2023-01-08 DIAGNOSIS — H43393 Other vitreous opacities, bilateral: Secondary | ICD-10-CM | POA: Diagnosis not present

## 2023-01-16 DIAGNOSIS — M5092 Unspecified cervical disc disorder, mid-cervical region, unspecified level: Secondary | ICD-10-CM | POA: Diagnosis not present

## 2023-01-22 DIAGNOSIS — M5092 Unspecified cervical disc disorder, mid-cervical region, unspecified level: Secondary | ICD-10-CM | POA: Diagnosis not present

## 2023-01-29 DIAGNOSIS — M5092 Unspecified cervical disc disorder, mid-cervical region, unspecified level: Secondary | ICD-10-CM | POA: Diagnosis not present

## 2023-02-12 DIAGNOSIS — M5092 Unspecified cervical disc disorder, mid-cervical region, unspecified level: Secondary | ICD-10-CM | POA: Diagnosis not present

## 2023-02-27 ENCOUNTER — Other Ambulatory Visit: Payer: Self-pay

## 2023-02-27 DIAGNOSIS — D352 Benign neoplasm of pituitary gland: Secondary | ICD-10-CM

## 2023-03-12 ENCOUNTER — Encounter: Payer: Self-pay | Admitting: Internal Medicine

## 2023-03-12 ENCOUNTER — Ambulatory Visit: Payer: BC Managed Care – PPO | Admitting: Internal Medicine

## 2023-03-12 ENCOUNTER — Ambulatory Visit: Payer: BC Managed Care – PPO | Admitting: Family Medicine

## 2023-03-12 VITALS — BP 110/76 | HR 62 | Ht 61.0 in | Wt 124.6 lb

## 2023-03-12 DIAGNOSIS — D352 Benign neoplasm of pituitary gland: Secondary | ICD-10-CM | POA: Diagnosis not present

## 2023-03-12 DIAGNOSIS — E221 Hyperprolactinemia: Secondary | ICD-10-CM | POA: Diagnosis not present

## 2023-03-12 LAB — COMPREHENSIVE METABOLIC PANEL
ALT: 15 U/L (ref 0–35)
AST: 17 U/L (ref 0–37)
Albumin: 4.4 g/dL (ref 3.5–5.2)
Alkaline Phosphatase: 30 U/L — ABNORMAL LOW (ref 39–117)
BUN: 11 mg/dL (ref 6–23)
CO2: 27 mEq/L (ref 19–32)
Calcium: 9.4 mg/dL (ref 8.4–10.5)
Chloride: 103 mEq/L (ref 96–112)
Creatinine, Ser: 0.57 mg/dL (ref 0.40–1.20)
GFR: 111.29 mL/min (ref >60.00)
Glucose, Bld: 77 mg/dL (ref 70–99)
Potassium: 4.1 mEq/L (ref 3.5–5.1)
Sodium: 139 mEq/L (ref 135–145)
Total Bilirubin: 0.5 mg/dL (ref 0.2–1.2)
Total Protein: 7.5 g/dL (ref 6.0–8.3)

## 2023-03-12 LAB — TSH: TSH: 0.63 u[IU]/mL (ref 0.35–5.50)

## 2023-03-12 LAB — T4, FREE: Free T4: 0.72 ng/dL (ref 0.60–1.60)

## 2023-03-12 NOTE — Progress Notes (Unsigned)
Name: Briana Valdez  MRN/ DOB: [id number], [birth year]    Age/ Sex: 42 y.o., female     PCP: Babs Sciara, MD   Reason for Endocrinology Evaluation: Pituitary adenoma     Initial Endocrinology Clinic Visit: 04/04/2021    PATIENT IDENTIFIER: Briana Valdez is a 42 y.o., female with a past medical history of pituitary adenoma. She has followed with [facility] since 04/04/2021 for consultative assistance with management of her pituitary adenoma.   HISTORICAL SUMMARY: The patient was first diagnosed with pituitary adenoma in 2018.  She has slight elevation of prolactin in the past as well as normal ophthalmological exam  She had left intermittent galactorrhea Prolactin level was 27.8 NG/mL in June 2022, patient was started on cabergoline She uses an IUD and rarely gets any menstruations  Has 3 biological boys   SUBJECTIVE:    Today (03/12/2023):  Briana Valdez is here for pituitary adenoma and prolactinoma.   Has occasional headaches that has been attributed to a cervical bulging disc, she underwent physical therapy and a course of oral steroids back in May 2024 She denies any constipation or diarrhea Has rare tremors with hunger Denies visual changes, up-to-date on eye exams Denies galactorrhea  Continues with IUD use   Father with recent diagnosis of cirrhosis, pending transplant evaluation She is on gabapentin through her PCP for anxiety  Cabergoline 0.5 mg , half a tab once weekly   HISTORY:  Past Medical History:  Past Medical History:  Diagnosis Date   Medical history non-contributory    Past Surgical History:  Past Surgical History:  Procedure Laterality Date   AUGMENTATION MAMMAPLASTY Bilateral 2010   COLONOSCOPY WITH PROPOFOL N/A 10/18/2020   Non-bleeding internal hemorrhoids, otherwise normal.    PLACEMENT OF BREAST IMPLANTS     Social History:  reports that she has never smoked. She has never used smokeless tobacco. She reports  that she does not drink alcohol and does not use drugs. Family History:  Family History  Problem Relation Age of Onset   Breast cancer Mother    Cancer Mother        breast   Diabetes Father    Hypertension Father    Breast cancer Maternal Grandmother    Breast cancer Paternal Grandmother    Colon cancer Paternal Grandmother    Colon polyps Neg Hx    Other Neg Hx      HOME MEDICATIONS: Allergies as of 03/12/2023   No Active Allergies      Medication List        Accurate as of March 12, 2023 10:58 AM. If you have any questions, ask your nurse or doctor.          cabergoline 0.5 MG tablet Commonly known as: DOSTINEX Take 0.5 tablets (0.25 mg total) by mouth once a week.   gabapentin 100 MG capsule Commonly known as: NEURONTIN 2 qhs   ibuprofen 200 MG tablet Commonly known as: ADVIL Take 400 mg by mouth every 6 (six) hours as needed for mild pain or moderate pain.   LORATADINE ALLERGY RELIEF D-12 PO Take 10 mg by mouth daily at 6 (six) AM.   sodium chloride 0.65 % Soln nasal spray Commonly known as: OCEAN Place 1 spray into both nostrils daily as needed for congestion.   STOOL SOFTENER PO Take 2 tablets by mouth daily.          OBJECTIVE:   PHYSICAL EXAM: VS: BP 110/76 (BP Location: Left  Arm, Patient Position: Sitting, Cuff Size: Small)   Pulse 62   Ht 5\' 1"  (1.549 m)   Wt 124 lb 9.6 oz (56.5 kg)   SpO2 99%   BMI 23.54 kg/m    EXAM: General: Pt appears well and is in NAD  Neck: General: Supple without adenopathy. Thyroid: Thyroid size normal.  No goiter or nodules appreciated.   Lungs: Clear with good BS bilat   Heart: Auscultation: RRR.  Abdomen:  soft, nontender  Extremities:  BL LE: No pretibial edema   Mental Status: Judgment, insight: Intact Orientation: Oriented to time, place, and person Mood and affect: No depression, anxiety, or agitation     DATA REVIEWED: ****  MRI Brain 10/04/2022  Brain: No acute infarction,  hemorrhage, hydrocephalus or extra-axial collection.   Pituitary/Sella: Stable appearance of hypoenhancing lesion on the left side of the pituitary gland measuring approximately 9 x 7 x 5 mm. This results in a convex contour of the pituitary gland extending into the suprasellar region and abutting the optic chiasm. The pituitary stalk is deviated to the right. No evidence of cavernous sinus invasion. The hypothalamus and mamillary bodies are normal. The infundibular and chiasmatic recesses are clear. Normal internal carotid artery flow voids.   Vascular: Normal flow voids.   Skull and upper cervical spine: Normal marrow signal.   Sinuses/Orbits: Negative.   Other: None.   IMPRESSION: Stable appearance of hypoenhancing lesion of the left side of the pituitary gland, consistent with a pituitary microadenoma.       ASSESSMENT / PLAN / RECOMMENDATIONS:   Pituitary Microadenoma:  - Brain MRI showed pituitary adenoma 9 x8x5 mm in 2022 -Repeat MRI 09/2022 showed stable appearance of hypoenhancing lesion of the left side of the pituitary gland. -No local symptoms -Up-to-date on eye exam  2.  Hyperprolactinemia:  -Prolactin has been normal since being on cabergoline   Medications   cabergoline 0.5 mg, half a tablet once  weekly   Follow-up in 6 months   Signed electronically by: Lyndle Herrlich, MD  [facility]  [facility] [address] [place], [place] [address] Phone: [phone] FAX: [phone]      CC: Babs Sciara, MD [address] [place] [address] Phone: [phone]  Fax: [phone]   Return to Endocrinology clinic as below: Future Appointments  Date Time Provider Department Center  03/19/2023  2:50 PM Babs Sciara, MD [facility]  09/17/2023  9:10 AM Jhene Westmoreland, Konrad Dolores, MD [facility] None

## 2023-03-13 LAB — PROLACTIN: Prolactin: 9.3 ng/mL

## 2023-03-13 MED ORDER — CABERGOLINE 0.5 MG PO TABS: 0.2500 mg | ORAL_TABLET | ORAL | 2 refills | Status: DC

## 2023-03-19 ENCOUNTER — Ambulatory Visit: Payer: BC Managed Care – PPO | Admitting: Family Medicine

## 2023-03-19 ENCOUNTER — Encounter: Payer: Self-pay | Admitting: Family Medicine

## 2023-03-19 VITALS — BP 110/70 | HR 79 | Temp 97.8°F | Ht 61.0 in | Wt 123.0 lb

## 2023-03-19 DIAGNOSIS — G542 Cervical root disorders, not elsewhere classified: Secondary | ICD-10-CM | POA: Diagnosis not present

## 2023-03-19 DIAGNOSIS — F419 Anxiety disorder, unspecified: Secondary | ICD-10-CM

## 2023-03-19 DIAGNOSIS — M542 Cervicalgia: Secondary | ICD-10-CM | POA: Diagnosis not present

## 2023-03-19 DIAGNOSIS — F439 Reaction to severe stress, unspecified: Secondary | ICD-10-CM

## 2023-03-19 NOTE — Progress Notes (Signed)
   Subjective:    Patient ID: Briana Valdez, female    DOB: [birth year], 42 y.o.   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 Has seen orthopedics that they did an MRI of her neck which showed 3 bulging disc No surgery indicated   Review of Systems     Objective:   Physical Exam General-in no acute distress Eyes-no discharge Lungs-respiratory rate normal, CTA CV-no murmurs,RRR Extremities skin warm dry no edema Neuro grossly normal Behavior normal, alert        Assessment & Plan:  1. Cervical nerve root impingement Not having any severe impingement currently gabapentin seems to be helping.  Continue current measures.  2. Cervical pain (neck) Orthopedics did an MRI which showed disc bulging at 3 levels but no surgery indicated.  3. Stress She is dealing better with the stress of her dad who has cirrhosis they are [facility]  4. Anxiety With stress levels gabapentin seems to be helping plus she is also putting off some levels of restriction in regards to her work so that she does not work excessive hours  Follow-up here in April 2025 sooner if any problems

## 2023-03-20 DIAGNOSIS — N819 Female genital prolapse, unspecified: Secondary | ICD-10-CM | POA: Diagnosis not present

## 2023-04-16 DIAGNOSIS — Z1283 Encounter for screening for malignant neoplasm of skin: Secondary | ICD-10-CM | POA: Diagnosis not present

## 2023-04-16 DIAGNOSIS — D225 Melanocytic nevi of trunk: Secondary | ICD-10-CM | POA: Diagnosis not present

## 2023-04-22 ENCOUNTER — Encounter: Payer: Self-pay | Admitting: Family Medicine

## 2023-04-24 NOTE — Telephone Encounter (Signed)
Nurses 1.-May have Zofran 4 mg tablet 1 every 6 hours as needed nausea, #18 with 2 refills  #2-it would be a good idea for them to do a follow-up in September to discuss these issues further-it is quite possible that counseling along with Zofran will help but it is also possible he may have to entertain additional measures

## 2023-04-30 ENCOUNTER — Encounter: Payer: Self-pay | Admitting: Obstetrics and Gynecology

## 2023-04-30 ENCOUNTER — Ambulatory Visit: Payer: BC Managed Care – PPO | Admitting: Obstetrics and Gynecology

## 2023-04-30 VITALS — BP 121/82 | HR 80 | Ht 61.0 in | Wt 123.8 lb

## 2023-04-30 DIAGNOSIS — N393 Stress incontinence (female) (male): Secondary | ICD-10-CM | POA: Diagnosis not present

## 2023-04-30 DIAGNOSIS — N812 Incomplete uterovaginal prolapse: Secondary | ICD-10-CM | POA: Diagnosis not present

## 2023-04-30 DIAGNOSIS — N3281 Overactive bladder: Secondary | ICD-10-CM | POA: Diagnosis not present

## 2023-04-30 DIAGNOSIS — R350 Frequency of micturition: Secondary | ICD-10-CM

## 2023-04-30 LAB — POCT URINALYSIS DIPSTICK
Bilirubin, UA: NEGATIVE
Blood, UA: NEGATIVE
Glucose, UA: NEGATIVE
Leukocytes, UA: NEGATIVE
Nitrite, UA: NEGATIVE
Protein, UA: NEGATIVE
Spec Grav, UA: 1.01 (ref 1.010–1.025)
Urobilinogen, UA: 0.2 U/dL
pH, UA: 5.5 (ref 5.0–8.0)

## 2023-04-30 NOTE — Patient Instructions (Signed)

## 2023-04-30 NOTE — Progress Notes (Unsigned)
Urogynecology New Patient Evaluation and Consultation  Referring Provider: Marlow Baars, MD PCP: Babs Sciara, MD Date of Service: 04/30/2023  SUBJECTIVE Chief Complaint: New Patient (Initial Visit) Briana Valdez is a 42 y.o. female here for consult for prolapse./)  History of Present Illness: Briana Valdez is a 42 y.o. White or Caucasian female seen in consultation at the request of Dr. Chestine Spore for evaluation of prolapse.    ***Review of records significant for: ***  Urinary Symptoms: Leaks urine with cough/ sneeze, laughing, exercise, lifting, and with urgency Leaks 1-2 time(s) per days, but not every day. SUI > UUI. If she gets an urge, usually can hold it.  Pad use: 1 liners/ mini-pads per day.   She is bothered by her UI symptoms.  Day time voids 5-6.  Nocturia: 0 times per night to void. Voiding dysfunction: she empties her bladder well.  does not use a catheter to empty bladder.  When urinating, she feels dribbling after finishing and the need to urinate multiple times in a row  UTIs:  0  UTI's in the last year.   Denies history of blood in urine and kidney or bladder stones  Pelvic Organ Prolapse Symptoms:                  She Admits to a feeling of a bulge the vaginal area. It has been present for 1 year.  She Admits to seeing a bulge.  This bulge is bothersome. Tried some pelvic floor exercises but did not help. Feels the bulge more often- but it does come and go.  Sometimes she can see her IUD strings hanging down.   Bowel Symptom: Bowel movements: 1 time(s) per day Stool consistency: soft  Straining: no.  Splinting: no.  Incomplete evacuation: no.   Denies accidental bowel leakage / fecal incontinence Bowel regimen: diet and stool softener  Sexual Function Sexually active: yes.  Sexual orientation:  heterosexual Pain with sex: Yes, has discomfort due to prolapse  Pelvic Pain Denies pelvic pain  Past Medical History:  Past Medical  History:  Diagnosis Date   Benign tumor of pituitary gland [facility])      Past Surgical History:   Past Surgical History:  Procedure Laterality Date   AUGMENTATION MAMMAPLASTY Bilateral 2010   COLONOSCOPY WITH PROPOFOL N/A 10/18/2020   Non-bleeding internal hemorrhoids, otherwise normal.    PLACEMENT OF BREAST IMPLANTS       Past OB/GYN History: OB History  Gravida Para Term Preterm AB Living  3 3 3     3   SAB IAB Ectopic Multiple Live Births          3    # Outcome Date GA Lbr Len/2nd Weight Sex Type Anes PTL Lv  3 Term      Vag-Spont     2 Term      Vag-Spont     1 Term      Vag-Spont       Does not get period  Contraception: IUD. Last pap smear was 05/2022.  Any history of abnormal pap smears: no.   Medications: She has a current medication list which includes the following prescription(s): cabergoline, docusate calcium, gabapentin, ibuprofen, and sodium chloride.   Allergies: Patient has no active allergies.   Social History:  Social History   Tobacco Use   Smoking status: Never   Smokeless tobacco: Never  Vaping Use   Vaping status: Never Used  Substance Use Topics   Alcohol use:  No   Drug use: No    Relationship status: married She lives with her husband and children.   She is employed as a Scientist, research (medical). Regular exercise: No History of abuse: No  Family History:   Family History  Problem Relation Age of Onset   Breast cancer Mother    Cancer Mother        breast   Diabetes Father    Hypertension Father    Liver disease Father    Breast cancer Maternal Grandmother    Breast cancer Paternal Grandmother    Colon cancer Paternal Grandmother    Colon polyps Neg Hx    Other Neg Hx      Review of Systems: Review of Systems  Constitutional:  Positive for malaise/fatigue. Negative for fever and weight loss.  Respiratory:  Negative for cough, shortness of breath and wheezing.   Cardiovascular:  Negative for chest pain, palpitations and leg swelling.   Gastrointestinal:  Negative for abdominal pain and blood in stool.  Genitourinary:  Negative for dysuria.       + vaginal discharge  Musculoskeletal:  Negative for myalgias.  Skin:  Negative for rash.  Neurological:  Negative for dizziness and headaches.  Endo/Heme/Allergies:  Bruises/bleeds easily.  Psychiatric/Behavioral:  Negative for depression. The patient is nervous/anxious.      OBJECTIVE Physical Exam: Vitals:   04/30/23 1246  BP: 121/82  Pulse: 80  Weight: 123 lb 12.8 oz (56.2 kg)  Height: 5\' 1"  (1.549 m)    Physical Exam Constitutional:      General: She is not in acute distress. Pulmonary:     Effort: Pulmonary effort is normal.  Abdominal:     General: There is no distension.     Palpations: Abdomen is soft.     Tenderness: There is no abdominal tenderness. There is no rebound.  Musculoskeletal:        General: No swelling. Normal range of motion.  Skin:    General: Skin is warm and dry.     Findings: No rash.  Neurological:     Mental Status: She is alert and oriented to person, place, and time.  Psychiatric:        Mood and Affect: Mood normal.        Behavior: Behavior normal.      GU / Detailed Urogynecologic Evaluation:  Pelvic Exam: Normal external female genitalia; Bartholin's and Skene's glands normal in appearance; urethral meatus normal in appearance, no urethral masses or discharge.   CST: negative  Speculum exam reveals normal vaginal mucosa without atrophy. Cervix normal appearance. IUD strings present. Uterus normal single, nontender. Adnexa no mass, fullness, tenderness.    Pelvic floor strength III/V Pelvic floor musculature: Right levator non-tender, Right obturator non-tender, Left levator non-tender, Left obturator non-tender  POP-Q:   POP-Q  0.5                                            Aa   0.5                                           Ba  1  C   6                                             Gh  4.5                                            Pb  7.5                                            tvl   -2                                            Ap  -2                                            Bp  -3.5                                              D      Rectal Exam:  Normal external rectrum  Post-Void Residual (PVR) by Bladder Scan: In order to evaluate bladder emptying, we discussed obtaining a postvoid residual and she agreed to this procedure.  Procedure: The ultrasound unit was placed on the patient's abdomen in the suprapubic region after the patient had voided. A PVR of 3 ml was obtained by bladder scan.  Laboratory Results: POC urine: negative   ASSESSMENT AND PLAN Ms. Gramajo is a 42 y.o. with:  1. Urinary frequency    UDS   Marguerita Beards, MD   Medical Decision Making:  - Reviewed/ ordered a clinical laboratory test - Reviewed/ ordered a radiologic study - Reviewed/ ordered medicine test - Decision to obtain old records - Discussion of management of or test interpretation with an external physician / other healthcare professional  - Assessment requiring independent historian - Review and summation of prior records - Independent review of image, tracing or specimen

## 2023-05-16 ENCOUNTER — Encounter: Payer: Self-pay | Admitting: Obstetrics and Gynecology

## 2023-05-16 ENCOUNTER — Ambulatory Visit (INDEPENDENT_AMBULATORY_CARE_PROVIDER_SITE_OTHER): Payer: BC Managed Care – PPO | Admitting: Obstetrics and Gynecology

## 2023-05-16 VITALS — BP 131/86 | HR 109

## 2023-05-16 DIAGNOSIS — R350 Frequency of micturition: Secondary | ICD-10-CM

## 2023-05-16 DIAGNOSIS — N3281 Overactive bladder: Secondary | ICD-10-CM

## 2023-05-16 DIAGNOSIS — N393 Stress incontinence (female) (male): Secondary | ICD-10-CM

## 2023-05-16 LAB — POCT URINALYSIS DIPSTICK
Bilirubin, UA: NEGATIVE
Blood, UA: NEGATIVE
Glucose, UA: NEGATIVE
Ketones, UA: NEGATIVE
Leukocytes, UA: NEGATIVE
Nitrite, UA: NEGATIVE
Protein, UA: NEGATIVE
Spec Grav, UA: 1.01 (ref 1.010–1.025)
Urobilinogen, UA: 0.2 E.U./dL
pH, UA: 7 (ref 5.0–8.0)

## 2023-05-16 NOTE — Progress Notes (Signed)
[facility] Urodynamics Procedure  Referring Physician: Babs Sciara, MD Date of Procedure: 05/16/2023  Briana Valdez is a 42 y.o. female who presents for urodynamic evaluation. Indication(s) for study: occult SUI  Vital Signs: BP 131/86   Pulse (!) 109   Laboratory Results: A catheterized urine specimen revealed:  POC urine Lab Results  Component Value Date   COLORU Yellow 05/16/2023   CLARITYU Clear 05/16/2023   GLUCOSEUR Negative 05/16/2023   BILIRUBINUR Negative 05/16/2023   KETONESU Negative 05/16/2023   SPECGRAV 1.010 05/16/2023   RBCUR Negative 05/16/2023   PHUR 7.0 05/16/2023   PROTEINUR Negative 05/16/2023   UROBILINOGEN 0.2 05/16/2023   LEUKOCYTESUR Negative 05/16/2023      Voiding Diary: Not Done  Procedure Timeout:  The correct patient was verified and the correct procedure was verified. The patient was in the correct position and safety precautions were reviewed based on at the patient's history.  Urodynamic Procedure A 23F dual lumen urodynamics catheter was placed under sterile conditions into the patient's bladder. A 23F catheter was placed into the rectum in order to measure abdominal pressure. EMG patches were placed in the appropriate position.  All connections were confirmed and calibrations/adjusted made. Saline was instilled into the bladder through the dual lumen catheters.  Cough/valsalva pressures were measured periodically during filling.  Patient was allowed to void.  The bladder was then emptied of its residual.  UROFLOW: Revealed a Qmax of 38.6 mL/sec.  She voided 616.2 mL and had a residual of 25 mL.  It was a normal pattern and represented normal habits.   CMG: This was performed with sterile water in the sitting position at a fill rate of 30 mL/min.    First sensation of fullness was 166 mLs,  First urge was 266 mLs,  Strong urge was 313 mLs and  Capacity was 749 mLs  Stress incontinence was demonstrated Highest  positive Barrier CLPP was 70 cmH20 at 630 ml. Highest negative Barrier VLPP was 58 cmH20 at 256 ml.  Detrusor function was normal, with very few DO's seen during test.   Compliance:  Normal. End fill detrusor pressure was 19.5cmH20.  Calculated compliance was 38.31mL/cmH20  UPP: MUCP with barrier reduction was 58 cm of water.    MICTURITION STUDY: Voiding was performed with reduction using scopettes in the sitting position.  Pdet at Qmax was 19.7 cm of water.  Qmax was 12.9 mL/sec.  It was a intermittent pattern.  She voided 709 mL and had a residual of 40 mL.  It was a volitional void, sustained detrusor contraction was present and abdominal straining was not present  EMG: This was performed with patches.  She had voluntary contractions, recruitment with fill was present and urethral sphincter was not relaxed with void.  The details of the procedure with the study tracings have been scanned into EPIC.   Urodynamic Impression:  1. Sensation was reduced; capacity was increased 2. Stress Incontinence was demonstrated at normal pressures; 3. Detrusor Overactivity was not demonstrated.  4. Emptying was dysfunctional with a normal PVR, a sustained detrusor contraction present,  abdominal straining not present, dyssynergic urethral sphincter activity on EMG.  Plan: - The patient will follow up  to discuss the findings and treatment options.

## 2023-05-27 ENCOUNTER — Telehealth: Payer: BC Managed Care – PPO | Admitting: Physician Assistant

## 2023-05-27 DIAGNOSIS — B9689 Other specified bacterial agents as the cause of diseases classified elsewhere: Secondary | ICD-10-CM

## 2023-05-27 DIAGNOSIS — J019 Acute sinusitis, unspecified: Secondary | ICD-10-CM

## 2023-05-27 MED ORDER — DOXYCYCLINE HYCLATE 100 MG PO TABS
100.0000 mg | ORAL_TABLET | Freq: Two times a day (BID) | ORAL | 0 refills | Status: DC
Start: 2023-05-27 — End: 2023-08-06

## 2023-05-27 NOTE — Progress Notes (Signed)
E-Visit for Sinus Problems  We are sorry that you are not feeling well.  Here is how we plan to help!  Based on what you have shared with me it looks like you have sinusitis.  Sinusitis is inflammation and infection in the sinus cavities of the head.  Based on your presentation I believe you most likely have Acute Bacterial Sinusitis.  This is an infection caused by bacteria and is treated with antibiotics. I have prescribed Doxycycline 100mg by mouth twice a day for 10 days. You may use an oral decongestant such as Mucinex D or if you have glaucoma or high blood pressure use plain Mucinex. Saline nasal spray help and can safely be used as often as needed for congestion.  If you develop worsening sinus pain, fever or notice severe headache and vision changes, or if symptoms are not better after completion of antibiotic, please schedule an appointment with a health care provider.    Sinus infections are not as easily transmitted as other respiratory infection, however we still recommend that you avoid close contact with loved ones, especially the very young and elderly.  Remember to wash your hands thoroughly throughout the day as this is the number one way to prevent the spread of infection!  Home Care: Only take medications as instructed by your medical team. Complete the entire course of an antibiotic. Do not take these medications with alcohol. A steam or ultrasonic humidifier can help congestion.  You can place a towel over your head and breathe in the steam from hot water coming from a faucet. Avoid close contacts especially the very young and the elderly. Cover your mouth when you cough or sneeze. Always remember to wash your hands.  Get Help Right Away If: You develop worsening fever or sinus pain. You develop a severe head ache or visual changes. Your symptoms persist after you have completed your treatment plan.  Make sure you Understand these instructions. Will watch your  condition. Will get help right away if you are not doing well or get worse.  Thank you for choosing an e-visit.  Your e-visit answers were reviewed by a board certified advanced clinical practitioner to complete your personal care plan. Depending upon the condition, your plan could have included both over the counter or prescription medications.  Please review your pharmacy choice. Make sure the pharmacy is open so you can pick up prescription now. If there is a problem, you may contact your provider through MyChart messaging and have the prescription routed to another pharmacy.  Your safety is important to us. If you have drug allergies check your prescription carefully.   For the next 24 hours you can use MyChart to ask questions about today's visit, request a non-urgent call back, or ask for a work or school excuse. You will get an email in the next two days asking about your experience. I hope that your e-visit has been valuable and will speed your recovery.  I have spent 5 minutes in review of e-visit questionnaire, review and updating patient chart, medical decision making and response to patient.   Shannon Kirkendall M Martika Egler, PA-C  

## 2023-06-05 ENCOUNTER — Ambulatory Visit: Payer: BC Managed Care – PPO | Admitting: Obstetrics and Gynecology

## 2023-06-05 ENCOUNTER — Encounter: Payer: Self-pay | Admitting: Obstetrics and Gynecology

## 2023-06-05 VITALS — BP 125/86 | HR 67

## 2023-06-05 DIAGNOSIS — N393 Stress incontinence (female) (male): Secondary | ICD-10-CM | POA: Diagnosis not present

## 2023-06-05 DIAGNOSIS — N812 Incomplete uterovaginal prolapse: Secondary | ICD-10-CM | POA: Diagnosis not present

## 2023-06-05 NOTE — Patient Instructions (Signed)
Plan for surgery: Exam under anesthesia, total vaginal hysterectomy (removal of uterus and cervix), bilateral salpingectomy (removal of tubes), uterosacral ligament suspension (lifting top of vagina), anterior repair (repair of vaginal wall where bladder sits), possible posterior repair (repair of vaginal wall where rectum sits), perineorrhaphy (narrowing of opening of vagina), urethral bulking (for leakage), cystoscopy (to look inside the bladder)

## 2023-06-05 NOTE — Progress Notes (Signed)
[facility] Return Visit  SUBJECTIVE  History of Present Illness: CHELLIE VANLUE is a 42 y.o. female seen in follow-up after urodynamic testing. She is interested in proceeding with surgery for prolapse and incontinence.   Urodynamic Impression:  1. Sensation was reduced; capacity was increased 2. Stress Incontinence was demonstrated at normal pressures; 3. Detrusor Overactivity was not demonstrated.  4. Emptying was dysfunctional with a normal PVR, a sustained detrusor contraction present,  abdominal straining not present, dyssynergic urethral sphincter activity on EMG.    Past Medical History: Patient  has a past medical history of Benign tumor of pituitary gland (HCC).   Past Surgical History: She  has a past surgical history that includes Placement of breast implants; Colonoscopy with propofol (N/A, 10/18/2020); and Augmentation mammaplasty (Bilateral, 2010).   Medications: She has a current medication list which includes the following prescription(s): cabergoline, docusate calcium, doxycycline, gabapentin, ibuprofen, and sodium chloride.   Allergies: Patient has No Known Allergies.   Social History: Patient  reports that she has never smoked. She has never used smokeless tobacco. She reports that she does not drink alcohol and does not use drugs.      OBJECTIVE     Physical Exam: Vitals:   06/05/23 0937  BP: 125/86  Pulse: 67   Gen: No apparent distress, A&O x 3.  Detailed Urogynecologic Evaluation:  Deferred. Prior exam showed:  POP-Q   0.5                                            Aa   0.5                                           Ba   1                                              C    6                                            Gh   4.5                                            Pb   7.5                                            tvl    -2                                            Ap   -2  Bp   -3.5                                              D        ASSESSMENT AND PLAN    Ms. Eve is a 42 y.o. with:  1. Uterovaginal prolapse, incomplete   2. SUI (stress urinary incontinence, female)     Plan for surgery: Exam under anesthesia, total vaginal hysterectomy, bilateral salpingectomy, uterosacral ligament suspension, anterior repair, possible posterior repair, perineorrhaphy, urethral bulking, cystoscopy  - We reviewed the patient's specific anatomic and functional findings, with the assistance of diagrams, and together finalized the above procedure. The planned surgical procedures were discussed along with the surgical risks outlined below, which were also provided on a detailed handout. Additional treatment options including expectant management, conservative management, medical management were discussed where appropriate.  We reviewed the benefits and risks of each treatment option.   General Surgical Risks: For all procedures, there are risks of bleeding, infection, damage to surrounding organs including but not limited to bowel, bladder, blood vessels, ureters and nerves, and need for further surgery if an injury were to occur. These risks are all low with minimally invasive surgery.   There are risks of numbness and weakness at any body site or buttock/rectal pain.  It is possible that baseline pain can be worsened by surgery, either with or without mesh. If surgery is vaginal, there is also a low risk of possible conversion to laparoscopy or open abdominal incision where indicated. Very rare risks include blood transfusion, blood clot, heart attack, pneumonia, or death.   There is also a risk of short-term postoperative urinary retention with need to use a catheter. About half of patients need to go home from surgery with a catheter, which is then later removed in the office. The risk of long-term need for a catheter is very low. There is also a risk of worsening of  overactive bladder.    Prolapse (with or without mesh): Risk factors for surgical failure  include things that put pressure on your pelvis and the surgical repair, including obesity, chronic cough, and heavy lifting or straining (including lifting children or adults, straining on the toilet, or lifting heavy objects such as furniture or anything weighing >25 lbs. Risks of recurrence is 20-30% with vaginal native tissue repair and a less than 10% with sacrocolpopexy with mesh.    - For preop Visit:  She is required to have a visit within 30 days of her surgery.   - Medical clearance: not required  - Anticoagulant use: No - Medicaid Hysterectomy form: No - Accepts blood transfusion: Yes - Expected length of stay: outpatient  Request sent for surgery scheduling.   Marguerita Beards, MD

## 2023-06-27 ENCOUNTER — Encounter: Payer: Self-pay | Admitting: *Deleted

## 2023-06-27 ENCOUNTER — Other Ambulatory Visit: Payer: Self-pay | Admitting: Obstetrics and Gynecology

## 2023-06-27 DIAGNOSIS — N812 Incomplete uterovaginal prolapse: Secondary | ICD-10-CM

## 2023-06-27 DIAGNOSIS — N393 Stress incontinence (female) (male): Secondary | ICD-10-CM

## 2023-07-09 DIAGNOSIS — Z01419 Encounter for gynecological examination (general) (routine) without abnormal findings: Secondary | ICD-10-CM | POA: Diagnosis not present

## 2023-07-23 ENCOUNTER — Encounter: Payer: Self-pay | Admitting: Family Medicine

## 2023-08-06 ENCOUNTER — Ambulatory Visit: Payer: BC Managed Care – PPO | Admitting: Physician Assistant

## 2023-08-06 VITALS — BP 135/83 | Ht 61.0 in | Wt 124.2 lb

## 2023-08-06 DIAGNOSIS — J019 Acute sinusitis, unspecified: Secondary | ICD-10-CM

## 2023-08-06 DIAGNOSIS — B9789 Other viral agents as the cause of diseases classified elsewhere: Secondary | ICD-10-CM | POA: Diagnosis not present

## 2023-08-06 DIAGNOSIS — H6993 Unspecified Eustachian tube disorder, bilateral: Secondary | ICD-10-CM | POA: Diagnosis not present

## 2023-08-06 NOTE — Progress Notes (Signed)
Acute Office Visit  Subjective:     Patient ID: Briana Valdez, female    DOB: May 07, 1981, 42 y.o.   MRN: [id number]   Sinus Problem Associated symptoms include ear pain (Pressure/fullness). Pertinent negatives include no chills, congestion, coughing, headaches or sore throat.   Patient is in today for sinus pressure and ear pressure.  She states symptoms began about 1 week ago with ear fullness, some dizziness and episodes of slightly blurred vision.  She reports dizziness and blurred vision are periodic and resolve after some time.  She states she is experiencing increased drainage in the back of her throat.  She admits to occasional episodes of diarrhea.  She denies nasal congestion or rhinorrhea.  She denies fevers or headaches.  Patient states she has tried taking over-the-counter Sudafed with moderate relief.  Review of Systems  Constitutional:  Negative for chills and fever.  HENT:  Positive for ear pain (Pressure/fullness). Negative for congestion, hearing loss and sore throat.        Post nasal drainage   Eyes:  Positive for blurred vision (Minimal). Negative for pain.  Respiratory:  Negative for cough and wheezing.   Cardiovascular:  Negative for chest pain and palpitations.  Gastrointestinal:  Positive for diarrhea. Negative for nausea and vomiting.  Neurological:  Negative for headaches.        Objective:     BP 135/83   Ht 5\' 1"  (1.549 m)   Wt 124 lb 3.2 oz (56.3 kg)   BMI 23.47 kg/m   Physical Exam Constitutional:      Appearance: Normal appearance. She is normal weight.  HENT:     Head: Normocephalic.     Right Ear: Tympanic membrane normal.     Left Ear: Tympanic membrane normal.     Nose: Nose normal. No congestion or rhinorrhea.     Right Turbinates: Swollen.     Left Turbinates: Swollen.     Mouth/Throat:     Mouth: Mucous membranes are moist.     Pharynx: Oropharynx is clear.  Eyes:     Extraocular Movements: Extraocular movements intact.      Conjunctiva/sclera: Conjunctivae normal.  Neck:     Thyroid: No thyroid mass, thyromegaly or thyroid tenderness.  Cardiovascular:     Rate and Rhythm: Normal rate and regular rhythm.     Heart sounds: No murmur heard.    No gallop.  Pulmonary:     Effort: Pulmonary effort is normal.     Breath sounds: Normal breath sounds. No wheezing, rhonchi or rales.  Abdominal:     Palpations: Abdomen is soft.  Musculoskeletal:     Cervical back: Normal range of motion.  Skin:    General: Skin is warm and dry.     Capillary Refill: Capillary refill takes less than 2 seconds.  Neurological:     General: No focal deficit present.     Mental Status: She is alert and oriented to person, place, and time.  Psychiatric:        Mood and Affect: Mood normal.        Behavior: Behavior normal.     No results found for any visits on 08/06/23.      Assessment & Plan:  Acute viral sinusitis  Eustachian tube dysfunction, bilateral   Patient appears stable today.  Physical exam overall benign, some sinus tenderness, nasal turbinates swollen with some erythema, ears and throat without acute findings.  Patient encouraged to use over-the-counter Flonase and Claritin for symptoms.  She may continue with Sudafed as needed.  She should use ibuprofen and Tylenol for pain relief as needed.  She has to contact me if symptoms are not improving by the end of the week.  At this time would consider antibiotic therapy for prolonged symptoms.  Return if symptoms worsen or fail to improve.  Toni Amend Drisana Schweickert, PA-C

## 2023-08-10 ENCOUNTER — Encounter: Payer: Self-pay | Admitting: Obstetrics and Gynecology

## 2023-08-10 ENCOUNTER — Encounter: Payer: Self-pay | Admitting: Physician Assistant

## 2023-08-10 ENCOUNTER — Other Ambulatory Visit (INDEPENDENT_AMBULATORY_CARE_PROVIDER_SITE_OTHER): Payer: BC Managed Care – PPO | Admitting: Physician Assistant

## 2023-08-10 DIAGNOSIS — J019 Acute sinusitis, unspecified: Secondary | ICD-10-CM

## 2023-08-10 MED ORDER — AMOXICILLIN-POT CLAVULANATE 875-125 MG PO TABS
1.0000 | ORAL_TABLET | Freq: Two times a day (BID) | ORAL | 0 refills | Status: DC
Start: 2023-08-10 — End: 2023-09-06

## 2023-08-11 DIAGNOSIS — U071 COVID-19: Secondary | ICD-10-CM | POA: Diagnosis not present

## 2023-08-11 DIAGNOSIS — R519 Headache, unspecified: Secondary | ICD-10-CM | POA: Diagnosis not present

## 2023-08-11 DIAGNOSIS — J019 Acute sinusitis, unspecified: Secondary | ICD-10-CM | POA: Diagnosis not present

## 2023-08-11 DIAGNOSIS — J101 Influenza due to other identified influenza virus with other respiratory manifestations: Secondary | ICD-10-CM | POA: Diagnosis not present

## 2023-08-28 ENCOUNTER — Encounter: Payer: Self-pay | Admitting: Obstetrics and Gynecology

## 2023-09-01 ENCOUNTER — Emergency Department (HOSPITAL_COMMUNITY)
Admission: EM | Admit: 2023-09-01 | Discharge: 2023-09-01 | Disposition: A | Discharge status: Home / Self Care | Payer: BC Managed Care – PPO | Attending: Emergency Medicine | Admitting: Emergency Medicine

## 2023-09-01 ENCOUNTER — Emergency Department (HOSPITAL_COMMUNITY): Payer: BC Managed Care – PPO

## 2023-09-01 ENCOUNTER — Other Ambulatory Visit: Payer: Self-pay

## 2023-09-01 DIAGNOSIS — R109 Unspecified abdominal pain: Secondary | ICD-10-CM

## 2023-09-01 DIAGNOSIS — N132 Hydronephrosis with renal and ureteral calculous obstruction: Secondary | ICD-10-CM | POA: Diagnosis not present

## 2023-09-01 DIAGNOSIS — N134 Hydroureter: Secondary | ICD-10-CM | POA: Diagnosis not present

## 2023-09-01 DIAGNOSIS — N200 Calculus of kidney: Secondary | ICD-10-CM | POA: Diagnosis not present

## 2023-09-01 DIAGNOSIS — R1030 Lower abdominal pain, unspecified: Secondary | ICD-10-CM | POA: Diagnosis not present

## 2023-09-01 DIAGNOSIS — Z6822 Body mass index (BMI) 22.0-22.9, adult: Secondary | ICD-10-CM | POA: Diagnosis not present

## 2023-09-01 DIAGNOSIS — M5459 Other low back pain: Secondary | ICD-10-CM | POA: Diagnosis not present

## 2023-09-01 DIAGNOSIS — R030 Elevated blood-pressure reading, without diagnosis of hypertension: Secondary | ICD-10-CM | POA: Diagnosis not present

## 2023-09-01 HISTORY — DX: Calculus of kidney: N20.0

## 2023-09-01 LAB — URINALYSIS, ROUTINE W REFLEX MICROSCOPIC
Bilirubin Urine: NEGATIVE
Glucose, UA: 50 mg/dL — AB
Ketones, ur: 20 mg/dL — AB
Leukocytes,Ua: NEGATIVE
Nitrite: NEGATIVE
Protein, ur: NEGATIVE mg/dL
Specific Gravity, Urine: 1.017 (ref 1.005–1.030)
pH: 5 (ref 5.0–8.0)

## 2023-09-01 LAB — PREGNANCY, URINE: Preg Test, Ur: NEGATIVE

## 2023-09-01 MED ORDER — KETOROLAC TROMETHAMINE 60 MG/2ML IM SOLN
60.0000 mg | Freq: Once | INTRAMUSCULAR | Status: AC
  Administered 2023-09-01: 60 mg via INTRAMUSCULAR
  Filled 2023-09-01: qty 2

## 2023-09-01 MED ORDER — NAPROXEN 500 MG PO TABS: 500.0000 mg | ORAL_TABLET | Freq: Two times a day (BID) | ORAL | 0 refills | Status: DC

## 2023-09-01 MED ORDER — ONDANSETRON 4 MG PO TBDP: 4.0000 mg | ORAL_TABLET | Freq: Three times a day (TID) | ORAL | 0 refills | Status: DC | PRN

## 2023-09-01 MED ORDER — OXYCODONE-ACETAMINOPHEN 5-325 MG PO TABS: 1.0000 | ORAL_TABLET | Freq: Four times a day (QID) | ORAL | 0 refills | Status: DC | PRN

## 2023-09-01 MED ORDER — OXYCODONE-ACETAMINOPHEN 5-325 MG PO TABS
1.0000 | ORAL_TABLET | Freq: Four times a day (QID) | ORAL | 0 refills | Status: DC | PRN
Start: 2023-09-01 — End: 2023-09-13

## 2023-09-01 MED ORDER — TAMSULOSIN HCL 0.4 MG PO CAPS: 0.4000 mg | ORAL_CAPSULE | Freq: Every day | ORAL | 0 refills | Status: AC

## 2023-09-01 NOTE — ED Notes (Signed)
Dr. Hyacinth Meeker at bedside with [place].

## 2023-09-01 NOTE — ED Triage Notes (Signed)
 C/o left flank pain, states she went to [facility] today for same. Started having N/V this evening.

## 2023-09-01 NOTE — Discharge Instructions (Addendum)
 Your CT scan shows a kidney stone, the radiologist thinks it is about 3 mm in size, this actually gives you a good chance of passing it but I would still call the urologist on Monday morning to make a close appointment.  In the meantime take the following medications  Please take Naprosyn , 500mg  by mouth twice daily as needed for pain - this in an antiinflammatory medicine (NSAID) and is similar to ibuprofen  - many people feel that it is stronger than ibuprofen  and it is easier to take since it is a smaller pill.  Please use this only for 1 week - if your pain persists, you will need to follow up with your doctor in the office for ongoing guidance and pain control.  Zofran  is a medication which can help with nausea.  You may take 4 mg by mouth every 6 hours as needed if you are an adult, if your child under the age of 6 take half of a tablet or 2 mg every 6 hours as needed.  This should dissolve on your tongue within a short timeframe.  Wait about 30 minutes after taking it to help with drinking clear liquids.  Flomax  once a day until the kidney stone passes, this may make you lightheaded  Percocet, 1 tablet every 6 hours as needed for severe pain only.  This can cause nausea and constipation.  Please do not drive while taking this medication  Thank you for allowing us  to treat you in the emergency department today.  After reviewing your examination and potential testing that was done it appears that you are safe to go home.  I would like for you to follow-up with your doctor within the next several days, have them obtain your records and follow-up with them to review all potential tests and results from your visit.  If you should develop severe or worsening symptoms return to the emergency department immediately

## 2023-09-01 NOTE — ED Provider Notes (Signed)
 [place] EMERGENCY DEPARTMENT AT [facility] Provider Note   CSN: [id number] Arrival date & time: 09/01/23  2039     History  Chief Complaint  Patient presents with   Flank Pain    Briana Valdez is a 43 y.o. female.   Flank Pain   This patient is a 43 year old female, she has no chronic medical problems other than having a prolapsed uterus.  She states that she has been having about 1 week of suprapubic discomfort followed by some progression into nausea and left flank pain.  She noticed that her urine was a little bit dark but has not had fevers or chills, she has been nauseated with no appetite today but no vomiting, no prior abdominal surgery, no history of kidney stones, she went to the urgent care today and was told that she had normal urine but was given a urine antibiotic just in case there was something that was infected.    Home Medications Prior to Admission medications   Medication Sig Start Date End Date Taking? Authorizing Provider  naproxen  (NAPROSYN ) 500 MG tablet Take 1 tablet (500 mg total) by mouth 2 (two) times daily with a meal. 09/01/23  Yes Cleotilde Rogue, MD  ondansetron  (ZOFRAN -ODT) 4 MG disintegrating tablet Take 1 tablet (4 mg total) by mouth every 8 (eight) hours as needed for nausea. 09/01/23  Yes Cleotilde Rogue, MD  oxyCODONE -acetaminophen  (PERCOCET/ROXICET) 5-325 MG tablet Take 1 tablet by mouth every 6 (six) hours as needed for severe pain (pain score 7-10). 09/01/23  Yes Cleotilde Rogue, MD  oxyCODONE -acetaminophen  (PERCOCET/ROXICET) 5-325 MG tablet Take 1 tablet by mouth every 6 (six) hours as needed for severe pain (pain score 7-10). 09/01/23  Yes Cleotilde Rogue, MD  tamsulosin  (FLOMAX ) 0.4 MG CAPS capsule Take 1 capsule (0.4 mg total) by mouth daily for 7 days. 09/01/23 09/08/23 Yes Cleotilde Rogue, MD  amoxicillin -clavulanate (AUGMENTIN ) 875-125 MG tablet Take 1 tablet by mouth 2 (two) times daily. 08/10/23   Grooms, Courtney, PA-C  cabergoline   (DOSTINEX ) 0.5 MG tablet Take 0.5 tablets (0.25 mg total) by mouth once a week. 03/13/23   Shamleffer, Ibtehal Jaralla, MD  Docusate Calcium (STOOL SOFTENER PO) Take 2 tablets by mouth daily.    [provider]  ibuprofen  (ADVIL ) 800 MG tablet Take 800 mg by mouth every 8 (eight) hours as needed.    [provider]  sodium chloride  (OCEAN) 0.65 % SOLN nasal spray Place 1 spray into both nostrils daily as needed for congestion.    [provider]      Allergies    Patient has no known allergies.    Review of Systems   Review of Systems  Genitourinary:  Positive for flank pain.  All other systems reviewed and are negative.   Physical Exam Updated Vital Signs Pulse 64   SpO2 96%  Physical Exam Vitals and nursing note reviewed.  Constitutional:      General: She is not in acute distress.    Appearance: She is well-developed.  HENT:     Head: Normocephalic and atraumatic.     Mouth/Throat:     Pharynx: No oropharyngeal exudate.  Eyes:     General: No scleral icterus.       Right eye: No discharge.        Left eye: No discharge.     Conjunctiva/sclera: Conjunctivae normal.     Pupils: Pupils are equal, round, and reactive to light.  Neck:     Thyroid : No  thyromegaly.     Vascular: No JVD.  Cardiovascular:     Rate and Rhythm: Normal rate and regular rhythm.     Heart sounds: Normal heart sounds. No murmur heard.    No friction rub. No gallop.  Pulmonary:     Effort: Pulmonary effort is normal. No respiratory distress.     Breath sounds: Normal breath sounds. No wheezing or rales.  Abdominal:     General: Bowel sounds are normal. There is no distension.     Palpations: Abdomen is soft. There is no mass.     Tenderness: There is abdominal tenderness.     Comments: Minimal suprapubic tenderness, there is left CVA tenderness  Musculoskeletal:        General: No tenderness. Normal range of motion.     Cervical back: Normal range of motion and neck  supple.     Right lower leg: No edema.     Left lower leg: No edema.  Lymphadenopathy:     Cervical: No cervical adenopathy.  Skin:    General: Skin is warm and dry.     Findings: No erythema or rash.  Neurological:     Mental Status: She is alert.     Coordination: Coordination normal.  Psychiatric:        Behavior: Behavior normal.     ED Results / Procedures / Treatments   Labs (all labs ordered are listed, but only abnormal results are displayed) Labs Reviewed  URINALYSIS, ROUTINE W REFLEX MICROSCOPIC - Abnormal; Notable for the following components:      Result Value   Glucose, UA 50 (*)    Hgb urine dipstick SMALL (*)    Ketones, ur 20 (*)    Bacteria, UA RARE (*)    All other components within normal limits  PREGNANCY, URINE    EKG None  Radiology CT Renal Stone Study Result Date: 09/01/2023 CLINICAL DATA:  Left flank pain x6 days. EXAM: CT ABDOMEN AND PELVIS WITHOUT CONTRAST TECHNIQUE: Multidetector CT imaging of the abdomen and pelvis was performed following the standard protocol without IV contrast. RADIATION DOSE REDUCTION: This exam was performed according to the departmental dose-optimization program which includes automated exposure control, adjustment of the mA and/or kV according to patient size and/or use of iterative reconstruction technique. COMPARISON:  None Available. FINDINGS: Lower chest: A right breast implant is noted. No acute abnormality is identified. Hepatobiliary: Very small foci of parenchymal low attenuation are seen within the right and left lobes of the liver. The largest is seen within the anteromedial aspect of the left lobe and measures 7 mm (axial CT image 20, CT series 2). No gallstones, gallbladder wall thickening, or biliary dilatation. Pancreas: Unremarkable. No pancreatic ductal dilatation or surrounding inflammatory changes. Spleen: Normal in size without focal abnormality. Adrenals/Urinary Tract: Adrenal glands are unremarkable. Kidneys  are normal in size without focal lesions. A 3 mm obstructing renal calculus is seen within the proximal to mid left ureter, with mild left-sided hydronephrosis and hydroureter. Several punctate nonobstructing renal calculi are seen within the mid and lower left kidney. The urinary bladder is poorly distended and subsequently limited in evaluation. Stomach/Bowel: Stomach is within normal limits. Appendix appears normal. No evidence of bowel wall thickening, distention, or inflammatory changes. Vascular/Lymphatic: Mild aortic atherosclerosis. No enlarged abdominal or pelvic lymph nodes. Reproductive: The uterus is retroverted in position and contains a probably positioned IUD. The bilateral adnexa are unremarkable. Other: No abdominal wall hernia or abnormality. No abdominopelvic ascites. Musculoskeletal: No acute  or significant osseous findings. IMPRESSION: 1. 3 mm obstructing renal calculus within the proximal to mid left ureter. 2. Several punctate nonobstructing left renal calculi. 3. Retroverted uterus with a probably positioned IUD. 4. Aortic atherosclerosis. Aortic Atherosclerosis (ICD10-I70.0). Electronically Signed   By: Suzen Dials M.D.   On: 09/01/2023 21:41    Procedures Ultrasound ED Renal  Date/Time: 09/01/2023 9:10 PM  Performed by: Cleotilde Rogue, MD Authorized by: Cleotilde Rogue, MD   Procedure details:    Indications: hydronephrosis     Technique:  CompleteImages: archivedStudy Limitations: patient compliance Left kidney findings:    Intra-abdominal fluid: not identified     Perinephric fluid: not identified     Hydronephrosis: moderate   Right kidney findings:    Intra-abdominal fluid: not identified     Perinephric fluid: not identified     Hydronephrosis: none   Bladder findings:    Bladder:  Unable to visualize   Free pelvic fluid: not identified   Comments:     Just urinated, bladder was empty, there was hydronephrosis on the left      Medications Ordered in  ED Medications  ketorolac  (TORADOL ) injection 60 mg (60 mg Intramuscular Given 09/01/23 2115)    ED Course/ Medical Decision Making/ A&P                                 Medical Decision Making Amount and/or Complexity of Data Reviewed Labs: ordered. Radiology: ordered.  Risk Prescription drug management.    This patient presents to the ED for concern of left flank pain and suprapubic pain differential diagnosis includes kidney stone, spleen abnormality, bowel abnormality, diverticulitis, urinary infection, mass or tumor    Additional history obtained:  Additional history obtained from medical record External records from outside source obtained and reviewed including multiple visits to patient's family doctor, no recent ER visits, no surgeries   Lab Tests:  I Ordered, and personally interpreted labs.  The pertinent results include: Urinalysis without obvious infection   Imaging Studies ordered:  I ordered imaging studies including CT renal study I independently visualized and interpreted imaging which showed no acute findings other than a 3 mm kidney stone on the left causing some hydronephrosis I agree with the radiologist interpretation   Medicines ordered and prescription drug management:  I ordered medication including Toradol  for pain, patient declined other medications Reevaluation of the patient after these medicines showed that the patient improved I have reviewed the patients home medicines and have made adjustments as needed   Problem List / ED Course:  Patient instructed on her results including kidney stone and need for follow-up with urology, she will take the medications as prescribed, she will be given a to go pack of for Percocet for home tonight   Social Determinants of Health:  None           Final Clinical Impression(s) / ED Diagnoses Final diagnoses:  Flank pain  Kidney stone on left side    Rx / DC Orders ED Discharge Orders           Ordered    oxyCODONE -acetaminophen  (PERCOCET/ROXICET) 5-325 MG tablet  Every 6 hours PRN        09/01/23 2148    naproxen  (NAPROSYN ) 500 MG tablet  2 times daily with meals        09/01/23 2150    ondansetron  (ZOFRAN -ODT) 4 MG disintegrating tablet  Every 8 hours PRN  09/01/23 2150    tamsulosin  (FLOMAX ) 0.4 MG CAPS capsule  Daily        09/01/23 2150    oxyCODONE -acetaminophen  (PERCOCET/ROXICET) 5-325 MG tablet  Every 6 hours PRN        09/01/23 2150              Cleotilde Rogue, MD 09/01/23 2151

## 2023-09-03 ENCOUNTER — Encounter: Payer: Self-pay | Admitting: Obstetrics and Gynecology

## 2023-09-03 ENCOUNTER — Encounter: Payer: Self-pay | Admitting: Family Medicine

## 2023-09-03 ENCOUNTER — Ambulatory Visit (INDEPENDENT_AMBULATORY_CARE_PROVIDER_SITE_OTHER): Payer: BC Managed Care – PPO | Admitting: Obstetrics and Gynecology

## 2023-09-03 VITALS — BP 138/80 | HR 87 | Wt 120.8 lb

## 2023-09-03 DIAGNOSIS — Z01818 Encounter for other preprocedural examination: Secondary | ICD-10-CM

## 2023-09-03 MED ORDER — OXYCODONE HCL 5 MG PO TABS: 5.0000 mg | ORAL_TABLET | ORAL | 0 refills | Status: DC | PRN

## 2023-09-03 MED ORDER — ONDANSETRON HCL 4 MG PO TABS: 4.0000 mg | ORAL_TABLET | Freq: Three times a day (TID) | ORAL | 0 refills | Status: DC | PRN

## 2023-09-03 MED ORDER — ACETAMINOPHEN 500 MG PO TABS: 500.0000 mg | ORAL_TABLET | Freq: Four times a day (QID) | ORAL | 0 refills | Status: DC | PRN

## 2023-09-03 MED ORDER — POLYETHYLENE GLYCOL 3350 17 GM/SCOOP PO POWD: 17.0000 g | Freq: Every day | ORAL | 0 refills | Status: DC

## 2023-09-03 MED ORDER — IBUPROFEN 600 MG PO TABS: 600.0000 mg | ORAL_TABLET | Freq: Four times a day (QID) | ORAL | 0 refills | Status: DC | PRN

## 2023-09-03 MED FILL — Oxycodone w/ Acetaminophen Tab 5-325 MG: ORAL | Qty: 6 | Status: AC

## 2023-09-03 NOTE — Progress Notes (Signed)
 [facility] Pre-Operative Exam  Subjective Chief Complaint: Briana Valdez presents for a preoperative encounter.   History of Present Illness: Briana Valdez is a 43 y.o. female who presents for preoperative visit.  She is scheduled to undergo  Exam under anesthesia, total vaginal hysterectomy, bilateral salpingectomy, uterosacral ligament suspension, anterior repair, possible posterior repair, perineorrhaphy, urethral bulking, cystoscopy  on 09/24/23.  Her symptoms include Pelvic organ prolapse and stress urinary incontinence, and she was was found to have Stage II anterior, Stage I posterior, Stage I apical prolapse.   Urodynamics showed: 1. Sensation was reduced; capacity was increased 2. Stress Incontinence was demonstrated at normal pressures; 3. Detrusor Overactivity was not demonstrated.  4. Emptying was dysfunctional with a normal PVR, a sustained detrusor contraction present,  abdominal straining not present, dyssynergic urethral sphincter activity on EMG.  Past Medical History:  Diagnosis Date   Benign tumor of pituitary gland [facility])      Past Surgical History:  Procedure Laterality Date   AUGMENTATION MAMMAPLASTY Bilateral 2010   COLONOSCOPY WITH PROPOFOL  N/A 10/18/2020   Non-bleeding internal hemorrhoids, otherwise normal.    PLACEMENT OF BREAST IMPLANTS      has no known allergies.   Family History  Problem Relation Age of Onset   Breast cancer Mother    Cancer Mother        breast   Diabetes Father    Hypertension Father    Liver disease Father    Breast cancer Maternal Grandmother    Breast cancer Paternal Grandmother    Colon cancer Paternal Grandmother    Colon polyps Neg Hx    Other Neg Hx     Social History   Tobacco Use   Smoking status: Never   Smokeless tobacco: Never  Vaping Use   Vaping status: Never Used  Substance Use Topics   Alcohol use: No   Drug use: No     Review of Systems was negative for a full 10 system  review except as noted in the History of Present Illness.   Current Outpatient Medications:    amoxicillin -clavulanate (AUGMENTIN ) 875-125 MG tablet, Take 1 tablet by mouth 2 (two) times daily., Disp: 20 tablet, Rfl: 0   cabergoline  (DOSTINEX ) 0.5 MG tablet, Take 0.5 tablets (0.25 mg total) by mouth once a week., Disp: 10 tablet, Rfl: 2   Docusate Calcium (STOOL SOFTENER PO), Take 2 tablets by mouth daily., Disp: , Rfl:    ibuprofen  (ADVIL ) 800 MG tablet, Take 800 mg by mouth every 8 (eight) hours as needed., Disp: , Rfl:    naproxen  (NAPROSYN ) 500 MG tablet, Take 1 tablet (500 mg total) by mouth 2 (two) times daily with a meal., Disp: 30 tablet, Rfl: 0   ondansetron  (ZOFRAN -ODT) 4 MG disintegrating tablet, Take 1 tablet (4 mg total) by mouth every 8 (eight) hours as needed for nausea., Disp: 10 tablet, Rfl: 0   oxyCODONE -acetaminophen  (PERCOCET/ROXICET) 5-325 MG tablet, Take 1 tablet by mouth every 6 (six) hours as needed for severe pain (pain score 7-10)., Disp: 4 tablet, Rfl: 0   oxyCODONE -acetaminophen  (PERCOCET/ROXICET) 5-325 MG tablet, Take 1 tablet by mouth every 6 (six) hours as needed for severe pain (pain score 7-10)., Disp: 15 tablet, Rfl: 0   sodium chloride  (OCEAN) 0.65 % SOLN nasal spray, Place 1 spray into both nostrils daily as needed for congestion., Disp: , Rfl:    tamsulosin  (FLOMAX ) 0.4 MG CAPS capsule, Take 1 capsule (0.4 mg total) by mouth daily for 7 days.,  Disp: 7 capsule, Rfl: 0   Objective Vitals:   09/03/23 0848  BP: 138/80  Pulse: 87    Gen: NAD CV: S1 S2 RRR Lungs: Clear to auscultation bilaterally Abd: soft, nontender   Previous Pelvic Exam showed:  Normal external female genitalia; Bartholin's and Skene's glands normal in appearance; urethral meatus normal in appearance, no urethral masses or discharge.    CST: negative   Speculum exam reveals normal vaginal mucosa without atrophy. Cervix normal appearance. IUD strings present. Uterus normal single,  nontender. Adnexa no mass, fullness, tenderness.     Pelvic floor strength III/V Pelvic floor musculature: Right levator non-tender, Right obturator non-tender, Left levator non-tender, Left obturator non-tender   POP-Q:    POP-Q   0.5                                            Aa   0.5                                           Ba   1                                              C    6                                            Gh   4.5                                            Pb   7.5                                            tvl    -2                                            Ap   -2                                            Bp   -3.5                                              D          Rectal Exam:  Normal external rectrum    Assessment/ Plan  Assessment: The patient is a 43 y.o. year old scheduled to undergo  Exam under anesthesia, total vaginal hysterectomy, bilateral salpingectomy, uterosacral ligament suspension, anterior repair,  possible posterior repair, perineorrhaphy, urethral bulking, cystoscopy. Verbal consent was obtained for these procedures.  Plan: General Surgical Consent: The patient has previously been counseled on alternative treatments, and the decision by the patient and provider was to proceed with the procedure listed above.  For all procedures, there are risks of bleeding, infection, damage to surrounding organs including but not limited to bowel, bladder, blood vessels, ureters and nerves, and need for further surgery if an injury were to occur. These risks are all low with minimally invasive surgery.   There are risks of numbness and weakness at any body site or buttock/rectal pain.  It is possible that baseline pain can be worsened by surgery, either with or without mesh. If surgery is vaginal, there is also a low risk of possible conversion to laparoscopy or open abdominal incision where indicated. Very rare risks include blood  transfusion, blood clot, heart attack, pneumonia, or death.   There is also a risk of short-term postoperative urinary retention with need to use a catheter. About half of patients need to go home from surgery with a catheter, which is then later removed in the office. The risk of long-term need for a catheter is very low. There is also a risk of worsening of overactive bladder.     Prolapse (with or without mesh): Risk factors for surgical failure  include things that put pressure on your pelvis and the surgical repair, including obesity, chronic cough, and heavy lifting or straining (including lifting children or adults, straining on the toilet, or lifting heavy objects such as furniture or anything weighing >25 lbs. Risks of recurrence is 20-30% with vaginal native tissue repair and a less than 10% with sacrocolpopexy with mesh.      We discussed consent for blood products. Risks for blood transfusion include allergic reactions, other reactions that can affect different body organs and managed accordingly, transmission of infectious diseases such as HIV or Hepatitis. However, the blood is screened. Patient consents for blood products.  Pre-operative instructions:  She was instructed to not take Aspirin/NSAIDs x 7days prior to surgery. She may continue her 81mg  ASA. Antibiotic prophylaxis was ordered as indicated.  Catheter use: Patient will go home with foley if needed after post-operative voiding trial.  Post-operative instructions:  She was provided with specific post-operative instructions, including precautions and signs/symptoms for which we would recommend contacting us , in addition to daytime and after-hours contact phone numbers. This was provided on a handout.   Post-operative medications: Prescriptions for motrin , tylenol , miralax , and oxycodone  were sent to her pharmacy. Discussed using ibuprofen  and tylenol  on a schedule to limit use of narcotics.   Laboratory testing:  We will  check labs: CBC and Type and screen  Preoperative clearance:  She does not require surgical clearance.    Post-operative follow-up:  A post-operative appointment will be made for 6 weeks from the date of surgery. If she needs a post-operative nurse visit for a voiding trial, that will be set up after she leaves the hospital.    Patient will call the clinic or use MyChart should anything change or any new issues arise.   Maidie Streight G Arriana Lohmann, NP

## 2023-09-03 NOTE — H&P (Addendum)
 [facility] H&P  Subjective Chief Complaint: Briana Valdez presents for a preoperative encounter.   History of Present Illness: Briana Valdez is a 43 y.o. female who presents for preoperative visit.  She is scheduled to undergo  Exam under anesthesia, total vaginal hysterectomy, bilateral salpingectomy, uterosacral ligament suspension, anterior repair, possible posterior repair, perineorrhaphy, urethral bulking, cystoscopy  on 09/24/23.  Her symptoms include Pelvic organ prolapse and stress urinary incontinence, and she was was found to have Stage II anterior, Stage I posterior, Stage I apical prolapse.   Urodynamics showed: 1. Sensation was reduced; capacity was increased 2. Stress Incontinence was demonstrated at normal pressures; 3. Detrusor Overactivity was not demonstrated.  4. Emptying was dysfunctional with a normal PVR, a sustained detrusor contraction present,  abdominal straining not present, dyssynergic urethral sphincter activity on EMG.  Past Medical History:  Diagnosis Date   Benign tumor of pituitary gland [facility])      Past Surgical History:  Procedure Laterality Date   AUGMENTATION MAMMAPLASTY Bilateral 2010   COLONOSCOPY WITH PROPOFOL  N/A 10/18/2020   Non-bleeding internal hemorrhoids, otherwise normal.    PLACEMENT OF BREAST IMPLANTS      has no known allergies.   Family History  Problem Relation Age of Onset   Breast cancer Mother    Cancer Mother        breast   Diabetes Father    Hypertension Father    Liver disease Father    Breast cancer Maternal Grandmother    Breast cancer Paternal Grandmother    Colon cancer Paternal Grandmother    Colon polyps Neg Hx    Other Neg Hx     Social History   Tobacco Use   Smoking status: Never   Smokeless tobacco: Never  Vaping Use   Vaping status: Never Used  Substance Use Topics   Alcohol use: No   Drug use: No     Review of Systems was negative for a full 10 system review except as  noted in the History of Present Illness.  No current facility-administered medications for this encounter.  Current Outpatient Medications:    acetaminophen  (TYLENOL ) 500 MG tablet, Take 1 tablet (500 mg total) by mouth every 6 (six) hours as needed (pain)., Disp: 30 tablet, Rfl: 0   amoxicillin -clavulanate (AUGMENTIN ) 875-125 MG tablet, Take 1 tablet by mouth 2 (two) times daily., Disp: 20 tablet, Rfl: 0   cabergoline  (DOSTINEX ) 0.5 MG tablet, Take 0.5 tablets (0.25 mg total) by mouth once a week., Disp: 10 tablet, Rfl: 2   Docusate Calcium (STOOL SOFTENER PO), Take 2 tablets by mouth daily., Disp: , Rfl:    ibuprofen  (ADVIL ) 600 MG tablet, Take 1 tablet (600 mg total) by mouth every 6 (six) hours as needed., Disp: 30 tablet, Rfl: 0   ibuprofen  (ADVIL ) 800 MG tablet, Take 800 mg by mouth every 8 (eight) hours as needed., Disp: , Rfl:    naproxen  (NAPROSYN ) 500 MG tablet, Take 1 tablet (500 mg total) by mouth 2 (two) times daily with a meal., Disp: 30 tablet, Rfl: 0   ondansetron  (ZOFRAN ) 4 MG tablet, Take 1 tablet (4 mg total) by mouth every 8 (eight) hours as needed for nausea or vomiting., Disp: 10 tablet, Rfl: 0   ondansetron  (ZOFRAN -ODT) 4 MG disintegrating tablet, Take 1 tablet (4 mg total) by mouth every 8 (eight) hours as needed for nausea., Disp: 10 tablet, Rfl: 0   oxyCODONE  (OXY IR/ROXICODONE ) 5 MG immediate release tablet, Take 1 tablet (5  mg total) by mouth every 4 (four) hours as needed for severe pain (pain score 7-10)., Disp: 15 tablet, Rfl: 0   oxyCODONE -acetaminophen  (PERCOCET/ROXICET) 5-325 MG tablet, Take 1 tablet by mouth every 6 (six) hours as needed for severe pain (pain score 7-10)., Disp: 4 tablet, Rfl: 0   oxyCODONE -acetaminophen  (PERCOCET/ROXICET) 5-325 MG tablet, Take 1 tablet by mouth every 6 (six) hours as needed for severe pain (pain score 7-10)., Disp: 15 tablet, Rfl: 0   polyethylene glycol powder (GLYCOLAX /MIRALAX ) 17 GM/SCOOP powder, Take 17 g by mouth daily. Drink  17g (1 scoop) dissolved in water  per day., Disp: 255 g, Rfl: 0   sodium chloride  (OCEAN) 0.65 % SOLN nasal spray, Place 1 spray into both nostrils daily as needed for congestion., Disp: , Rfl:    tamsulosin  (FLOMAX ) 0.4 MG CAPS capsule, Take 1 capsule (0.4 mg total) by mouth daily for 7 days., Disp: 7 capsule, Rfl: 0   Objective There were no vitals filed for this visit.   Gen: NAD CV: S1 S2 RRR Lungs: Clear to auscultation bilaterally Abd: soft, nontender   Previous Pelvic Exam showed:  Normal external female genitalia; Bartholin's and Skene's glands normal in appearance; urethral meatus normal in appearance, no urethral masses or discharge.    CST: negative   Speculum exam reveals normal vaginal mucosa without atrophy. Cervix normal appearance. IUD strings present. Uterus normal single, nontender. Adnexa no mass, fullness, tenderness.     Pelvic floor strength III/V Pelvic floor musculature: Right levator non-tender, Right obturator non-tender, Left levator non-tender, Left obturator non-tender   POP-Q:    POP-Q   0.5                                            Aa   0.5                                           Ba   1                                              C    6                                            Gh   4.5                                            Pb   7.5                                            tvl    -2  Ap   -2                                            Bp   -3.5                                              D          Rectal Exam:  Normal external rectrum    Assessment/ Plan  Assessment: The patient is a 43 y.o. year old scheduled to undergo  Exam under anesthesia, total vaginal hysterectomy, bilateral salpingectomy, uterosacral ligament suspension, anterior repair, possible posterior repair, perineorrhaphy, urethral bulking, cystoscopy. Verbal consent was obtained for these procedures.

## 2023-09-06 ENCOUNTER — Encounter: Payer: Self-pay | Admitting: Urology

## 2023-09-06 ENCOUNTER — Ambulatory Visit (HOSPITAL_COMMUNITY)
Admission: RE | Admit: 2023-09-06 | Discharge: 2023-09-06 | Disposition: A | Discharge status: Home / Self Care | Payer: BC Managed Care – PPO | Source: Ambulatory Visit | Attending: Urology | Admitting: Urology

## 2023-09-06 ENCOUNTER — Ambulatory Visit: Payer: BC Managed Care – PPO | Admitting: Urology

## 2023-09-06 VITALS — BP 121/84 | HR 98

## 2023-09-06 DIAGNOSIS — R3915 Urgency of urination: Secondary | ICD-10-CM | POA: Diagnosis not present

## 2023-09-06 DIAGNOSIS — I878 Other specified disorders of veins: Secondary | ICD-10-CM | POA: Diagnosis not present

## 2023-09-06 DIAGNOSIS — N201 Calculus of ureter: Secondary | ICD-10-CM | POA: Insufficient documentation

## 2023-09-06 LAB — URINALYSIS, ROUTINE W REFLEX MICROSCOPIC
Bilirubin, UA: NEGATIVE
Glucose, UA: NEGATIVE
Ketones, UA: NEGATIVE
Leukocytes,UA: NEGATIVE
Nitrite, UA: NEGATIVE
Protein,UA: NEGATIVE
RBC, UA: NEGATIVE
Specific Gravity, UA: <1.005 — ABNORMAL LOW (ref 1.005–1.030)
Urobilinogen, Ur: 0.2 mg/dL (ref 0.2–1.0)
pH, UA: 6.5 (ref 5.0–7.5)

## 2023-09-06 MED ORDER — GEMTESA 75 MG PO TABS: 1.0000 | ORAL_TABLET | Freq: Every day | ORAL | 0 refills | Status: DC

## 2023-09-06 NOTE — Progress Notes (Signed)
Name: Briana Valdez DOB: 02/21/1981 MRN: [id number]  History of Present Illness: Briana Valdez is a 43 y.o. female who presents today as a new patient at [facility]. All available relevant medical records have been reviewed.   Recent history: > 09/01/2023:  - Seen in ER for left flank pain. CT showed a 3 mm left proximal ureteral stone and several punctate nonobstructing left renal calculi.  > 09/06/2023:  - Seen by Dr. Annabell Howells.  - Reported urinary urgency and frequency.  - UA negative. - The plan was: 1. KUB ("Previous left ureteral stone is not definitively seen in the ureter. Stone is potentially in the pelvis in the region of the ureteropelvic junction.") 2. Continue Flomax for medical expulsive therapy. 3. Gemtesa 75 mg daily for urgency/frequency. 4. "Return in 2 weeks with another KUB since she is scheduled for a hysterectomy and prolapse repair on 09/24/2023 at [facility] and we may need to coordinate ureteroscopy if the stone hasn't passed."  Today: KUB today: Awaiting radiology read; the distal left ureteral stone appears to have passed per provider interpretation.  She denies flank pain or abdominal pain. She denies fevers, nausea, or vomiting.  She denies increased urinary urgency, frequency, nocturia, dysuria, gross hematuria, hesitancy, straining to void, or sensations of incomplete emptying.   Fall Screening: Do you usually have a device to assist in your mobility? No   Medications: Current Outpatient Medications  Medication Sig Dispense Refill   cabergoline (DOSTINEX) 0.5 MG tablet Take 0.5 tablets (0.25 mg total) by mouth once a week. 10 tablet 2   No current facility-administered medications for this visit.    Allergies: No Known Allergies  Past Medical History:  Diagnosis Date   Benign tumor of pituitary gland [facility])    Past Surgical History:  Procedure Laterality Date   AUGMENTATION MAMMAPLASTY Bilateral 2010   COLONOSCOPY WITH PROPOFOL  N/A 10/18/2020   Non-bleeding internal hemorrhoids, otherwise normal.    PLACEMENT OF BREAST IMPLANTS     Family History  Problem Relation Age of Onset   Breast cancer Mother    Cancer Mother        breast   Diabetes Father    Hypertension Father    Liver disease Father    Breast cancer Maternal Grandmother    Breast cancer Paternal Grandmother    Colon cancer Paternal Grandmother    Colon polyps Neg Hx    Other Neg Hx    Social History   Socioeconomic History   Marital status: Married    Spouse name: Therapist, nutritional   Number of children: 3   Years of education: Not on file   Highest education level: Associate degree: occupational, Scientist, product/process development, or vocational program  Occupational History    Comment: [name]   Occupation: Producer, television/film/video  Tobacco Use   Smoking status: Never   Smokeless tobacco: Never  Vaping Use   Vaping status: Never Used  Substance and Sexual Activity   Alcohol use: No   Drug use: No   Sexual activity: Yes    Birth control/protection: I.U.D.  Other Topics Concern   Not on file  Social History Narrative   Lives with family   Social Drivers of Health   Financial Resource Strain: Low Risk  (08/06/2023)   Overall Financial Resource Strain (CARDIA)    Difficulty of Paying Living Expenses: Not hard at all  Food Insecurity: No Food Insecurity (08/06/2023)   Hunger Vital Sign    Worried About Programme researcher, broadcasting/film/video in  the Last Year: Never true    Ran Out of Food in the Last Year: Never true  Transportation Needs: No Transportation Needs (08/06/2023)   PRAPARE - Administrator, Civil Service (Medical): No    Lack of Transportation (Non-Medical): No  Physical Activity: Unknown (08/06/2023)   Exercise Vital Sign    Days of Exercise per Week: 0 days    Minutes of Exercise per Session: Not on file  Stress: Stress Concern Present (08/06/2023)   [employer] of Occupational Health - Occupational Stress Questionnaire    Feeling of Stress : Rather much   Social Connections: Socially Integrated (08/06/2023)   Social Connection and Isolation Panel [NHANES]    Frequency of Communication with Friends and Family: More than three times a week    Frequency of Social Gatherings with Friends and Family: Once a week    Attends Religious Services: More than 4 times per year    Active Member of [employer] or Organizations: Yes    Attends Banker Meetings: 1 to 4 times per year    Marital Status: Married  Catering manager Violence: Not on file    SUBJECTIVE  Review of Systems Constitutional: Patient denies any unintentional weight loss or change in strength lntegumentary: Patient denies any rashes or pruritus Cardiovascular: Patient denies chest pain or syncope Respiratory: Patient denies shortness of breath Gastrointestinal: Patient denies nausea, vomiting, constipation, or diarrhea Musculoskeletal: Patient denies muscle cramps or weakness Neurologic: Patient denies convulsions or seizures Allergic/Immunologic: Patient denies recent allergic reaction(s) Hematologic/Lymphatic: Patient denies bleeding tendencies Endocrine: Patient denies heat/cold intolerance  GU: As per HPI.  OBJECTIVE Vitals:   09/13/23 1322  BP: 127/86  Pulse: 96  Temp: 98.8 F (37.1 C)   There is no height or weight on file to calculate BMI.  Physical Examination Constitutional: No obvious distress; patient is non-toxic appearing  Cardiovascular: No visible lower extremity edema.  Respiratory: The patient does not have audible wheezing/stridor; respirations do not appear labored  Gastrointestinal: Abdomen non-distended Musculoskeletal: Normal ROM of UEs  Skin: No obvious rashes/open sores  Neurologic: CN 2-12 grossly intact Psychiatric: Answered questions appropriately with normal affect  Hematologic/Lymphatic/Immunologic: No obvious bruises or sites of spontaneous bleeding  UA: negative   ASSESSMENT Left ureteral stone - Plan: Urinalysis, Routine w  reflex microscopic  Kidney stones  We reviewed recent imaging results; awaiting radiology results, appears to have no acute findings per provider interpretation.  Gemtesa discontinued (was only for transient symptoms related to stone).  Advised adequate hydration and we discussed option to consider low oxalate diet given that calcium oxalate is the most common type of stone. Handout provided about stone prevention diet.  Will plan for follow up in 6 months with KUB for stone surveillance or sooner if needed. Pt verbalized understanding and agreement. All questions were answered.   PLAN Advised the following: Adequate fluid intake. Drink citrus juice (lemon, lime or orange juice) routinely. Low oxalate diet. Return in about 6 months (around 03/12/2024) for KUB, UA, & f/u with Evette Georges NP.  Orders Placed This Encounter  Procedures   Urinalysis, Routine w reflex microscopic    It has been explained that the patient is to follow regularly with their PCP in addition to all other providers involved in their care and to follow instructions provided by these respective offices. Patient advised to contact urology clinic if any urologic-pertaining questions, concerns, new symptoms or problems arise in the interim period.  Patient Instructions  >80% of stones  are calcium oxalate. This type of stones forms when body either isn't clearing oxalate well enough, is making too much oxalate, or too little citrate. This results in oxalate binding to form crystals, which continue to aggregate and form stones.  Limiting calcium does not help, but limiting oxalate in the diet can help. Increasing citric acid intake may also help.  The following measures may help to prevent the recurrence of stones: Increase water intake to 2-2.5 liters per day May add citrus juice (lemon, lime or orange juice) to water Moderation in dairy foods Decrease in salt content 5. Low Oxalate diet: Oxylates are found in  foods like Tomato, Spinach, red wine and chocolate (see additional resources below).  Internet resources for information regarding low oxalate diet: https://kidneystones.yangchunwu.com https://my.VerticalStretch.be  Foods Low in Sodium or Oxalate Foods You Can Eat  Drinks Coffee, fruit and veggie juice (using the recommended veggies), fruit punch  Fruits Apples, apricots (fresh or canned), avocado, bananas, cherries (sweet), cranberries, grapefruit, red or green grapes, lemon and lime juice, melons, nectarines, papayas, peaches, pears, pineapples, oranges, strawberries (fresh), tangerines  Veggies Artichokes, asparagus, bamboo shoots, broccoli, brussels sprouts, cabbage, cauliflower, chayote squash, chicory, corn, cucumbers, endive, lettuce, lima beans, mushrooms, onions, peas, peppers, potatoes, radishes, rutabagas, zucchini  Breads, Cereals, Grains Egg noodles, rye bread, cooked and dry cereals without nuts or bran, crackers with unsalted tops, white or wild rice  Meat, Meat Replacements, Fish, Recruitment consultant, fish, poultry, eggs, egg whites, egg replacements  Soup Homemade soup (using the recommended veggies and meat), low-sodium bouillon, low-sodium canned  Desserts Cookies, cakes, ice cream, pudding without chocolate or nuts, candy without chocolate or nuts  Fats and Oils Butter, margarine, cream, oil, salad dressing, mayo  Other Foods Unsalted potato chips or pretzels, herbs (like garlic, garlic powder, onion powder), lemon juice, salt-free seasoning blends, vinegar  Other Foods Low in Oxalate Foods You Can Eat  Drinks Beer, cola, wine, buttermilk, lemonade or limeade (without added vitamin C), milk  Meat, Meat Replacements, Fish, [employer] meat, ham, bacon, hot dogs, bratwurst, sausage, chicken nuggets, cheddar cheese, canned fish and shellfish  Soup Tomato soup, cheese soup  Other Foods  Coconuts, lemon or lime juices, sugar or sweeteners, jellies or jams (from the recommended list)   Moderate-Oxalate Foods Foods to Limit   Drinks Fruit and veggie juices (from the list below), chocolate milk, rice milk, hot cocoa, tea   Fruits Blackberries, blueberries, black currants, cherries (sour), fruit cocktail, mangoes, orange peel, prunes, purple plums   Veggies Baked beans, carrots, celery, green beans, parsnips, summer squash, tomatoes, turnips   Breads, Cereals, Grains White bread, cornbread or cornmeal, white English muffins, saltine or soda crackers, brown rice, vanilla wafers, spaghetti and other noodles, firm tofu, bagels, oatmeal   Meat/meat replacements, fish, poultry Sardines   Desserts Chocolate cake   Fats and Oils Macadamia nuts, pistachio nuts, English walnuts   Other Foods Jams or jellies (made with the fruits above), pepper    High-Oxalate Foods Foods to Avoid Drinks Chocolate drink mixes, soy milk, Ovaltine, instant iced tea, fruit juices of fruits listed below Fruits Apricots (dried), red currants, figs, kiwi, plums, rhubarb Veggies Beans (wax, dried), beets and beet greens, chives, collard greens, eggplant, escarole, dark greens of all kinds, leeks, okra, parsley, rutabagas, spinach, Swiss chard, tomato paste, watercress Breads, Cereals, Grains Amaranth, barley, white corn flour, fried potatoes, fruitcake, grits, soybean products, sweet potatoes, wheat germ and bran, buckwheat flour, All Bran cereal, graham crackers, pretzels, whole wheat bread Meat/meat  replacements, fish, poultry  Dried beans, peanut butter, soy burgers, miso  Desserts Carob, chocolate, marmalades Fats and Oils Nuts (peanuts, almonds, pecans, cashews, hazelnuts), nut butters, sesame seeds, tahini paste Other Foods Poppy seeds   Electronically signed by: Donnita Falls, MSN, FNP-C, CUNP 09/13/2023 1:42 PM

## 2023-09-06 NOTE — Progress Notes (Signed)
Subjective: 1. Ureteral stone   2. Urgency of urination      Consult requested by Dr. Lilyan Punt.  09/06/23: Briana Valdez is a 43 yo female who is sent for a 3 mm left proximal stone seen on CT on 09/01/23 for a 6 day history of left flank pain.   Her UA had rare bacteria with 6-10 RBC's and small blood on the dipstick. She has no prior stones.   She had the worst pain Sunday.  She now has urgency and frequency that has been worse over the last few days.  Her UA is clear.  She is on tamsulosin.   She has some nausea.  She has had no fever or chills. She has had no hematuria.  Her UA today is clear.  ROS:  Review of Systems  HENT:  Positive for congestion.   Gastrointestinal:  Positive for heartburn and nausea.  All other systems reviewed and are negative.   No Known Allergies  Past Medical History:  Diagnosis Date   Benign tumor of pituitary gland [facility])     Past Surgical History:  Procedure Laterality Date   AUGMENTATION MAMMAPLASTY Bilateral 2010   COLONOSCOPY WITH PROPOFOL N/A 10/18/2020   Non-bleeding internal hemorrhoids, otherwise normal.    PLACEMENT OF BREAST IMPLANTS      Social History   Socioeconomic History   Marital status: Married    Spouse name: Therapist, nutritional   Number of children: 3   Years of education: Not on file   Highest education level: Associate degree: occupational, Scientist, product/process development, or vocational program  Occupational History    Comment: Briana Valdez   Occupation: Producer, television/film/video  Tobacco Use   Smoking status: Never   Smokeless tobacco: Never  Vaping Use   Vaping status: Never Used  Substance and Sexual Activity   Alcohol use: No   Drug use: No   Sexual activity: Yes    Birth control/protection: I.U.D.  Other Topics Concern   Not on file  Social History Narrative   Lives with family   Social Drivers of Health   Financial Resource Strain: Low Risk  (08/06/2023)   Overall Financial Resource Strain (CARDIA)    Difficulty of Paying Living Expenses: Not hard at all   Food Insecurity: No Food Insecurity (08/06/2023)   Hunger Vital Sign    Worried About Running Out of Food in the Last Year: Never true    Ran Out of Food in the Last Year: Never true  Transportation Needs: No Transportation Needs (08/06/2023)   PRAPARE - Administrator, Civil Service (Medical): No    Lack of Transportation (Non-Medical): No  Physical Activity: Unknown (08/06/2023)   Exercise Vital Sign    Days of Exercise per Week: 0 days    Minutes of Exercise per Session: Not on file  Stress: Stress Concern Present (08/06/2023)   [employer] of Occupational Health - Occupational Stress Questionnaire    Feeling of Stress : Rather much  Social Connections: Socially Integrated (08/06/2023)   Social Connection and Isolation Panel [NHANES]    Frequency of Communication with Friends and Family: More than three times a week    Frequency of Social Gatherings with Friends and Family: Once a week    Attends Religious Services: More than 4 times per year    Active Member of [employer] or Organizations: Yes    Attends Banker Meetings: 1 to 4 times per year    Marital Status: Married  Catering manager Violence: Not on file  Family History  Problem Relation Age of Onset   Breast cancer Mother    Cancer Mother        breast   Diabetes Father    Hypertension Father    Liver disease Father    Breast cancer Maternal Grandmother    Breast cancer Paternal Grandmother    Colon cancer Paternal Grandmother    Colon polyps Neg Hx    Other Neg Hx     Anti-infectives: Anti-infectives (From admission, onward)    None       Current Outpatient Medications  Medication Sig Dispense Refill   cabergoline (DOSTINEX) 0.5 MG tablet Take 0.5 tablets (0.25 mg total) by mouth once a week. 10 tablet 2   ibuprofen (ADVIL) 800 MG tablet Take 800 mg by mouth every 8 (eight) hours as needed.     tamsulosin (FLOMAX) 0.4 MG CAPS capsule Take 1 capsule (0.4 mg total) by mouth  daily for 7 days. 7 capsule 0   Vibegron (GEMTESA) 75 MG TABS Take 1 tablet (75 mg total) by mouth daily. 14 tablet 0   acetaminophen (TYLENOL) 500 MG tablet Take 1 tablet (500 mg total) by mouth every 6 (six) hours as needed (pain). (Patient not taking: Reported on 09/06/2023) 30 tablet 0   Docusate Calcium (STOOL SOFTENER PO) Take 2 tablets by mouth daily. (Patient not taking: Reported on 09/06/2023)     ibuprofen (ADVIL) 600 MG tablet Take 1 tablet (600 mg total) by mouth every 6 (six) hours as needed. (Patient not taking: Reported on 09/06/2023) 30 tablet 0   naproxen (NAPROSYN) 500 MG tablet Take 1 tablet (500 mg total) by mouth 2 (two) times daily with a meal. (Patient not taking: Reported on 09/06/2023) 30 tablet 0   ondansetron (ZOFRAN) 4 MG tablet Take 1 tablet (4 mg total) by mouth every 8 (eight) hours as needed for nausea or vomiting. (Patient not taking: Reported on 09/06/2023) 10 tablet 0   ondansetron (ZOFRAN-ODT) 4 MG disintegrating tablet Take 1 tablet (4 mg total) by mouth every 8 (eight) hours as needed for nausea. (Patient not taking: Reported on 09/06/2023) 10 tablet 0   oxyCODONE (OXY IR/ROXICODONE) 5 MG immediate release tablet Take 1 tablet (5 mg total) by mouth every 4 (four) hours as needed for severe pain (pain score 7-10). (Patient not taking: Reported on 09/06/2023) 15 tablet 0   oxyCODONE-acetaminophen (PERCOCET/ROXICET) 5-325 MG tablet Take 1 tablet by mouth every 6 (six) hours as needed for severe pain (pain score 7-10). (Patient not taking: Reported on 09/06/2023) 4 tablet 0   oxyCODONE-acetaminophen (PERCOCET/ROXICET) 5-325 MG tablet Take 1 tablet by mouth every 6 (six) hours as needed for severe pain (pain score 7-10). (Patient not taking: Reported on 09/06/2023) 15 tablet 0   polyethylene glycol powder (GLYCOLAX/MIRALAX) 17 GM/SCOOP powder Take 17 g by mouth daily. Drink 17g (1 scoop) dissolved in water per day. (Patient not taking: Reported on 09/06/2023) 255 g 0   No current  facility-administered medications for this visit.     Objective: Vital signs in last 24 hours: BP 121/84   Pulse 98   Intake/Output from previous day: No intake/output data recorded. Intake/Output this shift: @IOTHISSHIFT @   Physical Exam Vitals reviewed.  Constitutional:      Appearance: Normal appearance.  Cardiovascular:     Rate and Rhythm: Normal rate and regular rhythm.     Heart sounds: Normal heart sounds.  Pulmonary:     Effort: Pulmonary effort is normal. No respiratory distress.  Breath sounds: Normal breath sounds.  Abdominal:     General: Abdomen is flat.     Palpations: Abdomen is soft.     Tenderness: There is no abdominal tenderness.  Neurological:     Mental Status: She is alert.     Lab Results:  Results for orders placed or performed in visit on 09/06/23 (from the past 24 hours)  Urinalysis, Routine w reflex microscopic     Status: Abnormal   Collection Time: 09/06/23  2:44 PM  Result Value Ref Range   Specific Gravity, UA <1.005 (L) 1.005 - 1.030   pH, UA 6.5 5.0 - 7.5   Color, UA Yellow Yellow   Appearance Ur Clear Clear   Leukocytes,UA Negative Negative   Protein,UA Negative Negative/Trace   Glucose, UA Negative Negative   Ketones, UA Negative Negative   RBC, UA Negative Negative   Bilirubin, UA Negative Negative   Urobilinogen, Ur 0.2 0.2 - 1.0 mg/dL   Nitrite, UA Negative Negative   Microscopic Examination Comment    Narrative   Performed at:  [address] [facility] [address], [place], [place]  [id number] Lab Director: Chinita Pester MT, Phone:  [phone]    BMET No results for input(s): "NA", "K", "CL", "CO2", "GLUCOSE", "BUN", "CREATININE", "CALCIUM" in the last 72 hours. PT/INR No results for input(s): "LABPROT", "INR" in the last 72 hours. ABG No results for input(s): "PHART", "HCO3" in the last 72 hours.  Invalid input(s): "PCO2", "PO2" Recent Results (from the past 2160 hours)  Urinalysis, Routine w reflex  microscopic -Urine, Clean Catch     Status: Abnormal   Collection Time: 09/01/23  8:50 PM  Result Value Ref Range   Color, Urine YELLOW YELLOW   APPearance CLEAR CLEAR   Specific Gravity, Urine 1.017 1.005 - 1.030   pH 5.0 5.0 - 8.0   Glucose, UA 50 (A) NEGATIVE mg/dL   Hgb urine dipstick SMALL (A) NEGATIVE   Bilirubin Urine NEGATIVE NEGATIVE   Ketones, ur 20 (A) NEGATIVE mg/dL   Protein, ur NEGATIVE NEGATIVE mg/dL   Nitrite NEGATIVE NEGATIVE   Leukocytes,Ua NEGATIVE NEGATIVE   RBC / HPF 0-5 0 - 5 RBC/hpf   WBC, UA 6-10 0 - 5 WBC/hpf   Bacteria, UA RARE (A) NONE SEEN   Squamous Epithelial / HPF 0-5 0 - 5 /HPF   Mucus PRESENT     Comment: Performed at [facility], [address], [place], [place] [address]  Pregnancy, urine     Status: None   Collection Time: 09/01/23  8:50 PM  Result Value Ref Range   Preg Test, Ur NEGATIVE NEGATIVE    Comment:        THE SENSITIVITY OF THIS METHODOLOGY IS >25 mIU/mL. Performed at [facility], [address] [place], [place] [address]   Urinalysis, Routine w reflex microscopic     Status: Abnormal   Collection Time: 09/06/23  2:44 PM  Result Value Ref Range   Specific Gravity, UA <1.005 (L) 1.005 - 1.030   pH, UA 6.5 5.0 - 7.5   Color, UA Yellow Yellow   Appearance Ur Clear Clear   Leukocytes,UA Negative Negative   Protein,UA Negative Negative/Trace   Glucose, UA Negative Negative   Ketones, UA Negative Negative   RBC, UA Negative Negative   Bilirubin, UA Negative Negative   Urobilinogen, Ur 0.2 0.2 - 1.0 mg/dL   Nitrite, UA Negative Negative   Microscopic Examination Comment     Comment: Microscopic not indicated and  not performed.    Studies/Results: DG Abd 1 View Result Date: 09/06/2023 CLINICAL DATA:  Left ureteral stone. EXAM: ABDOMEN - 1 VIEW COMPARISON:  CT 09/01/2023 FINDINGS: Previous left ureteral stone is not definitively seen in the ureter. Stone is potentially in the pelvis in the region of the ureteropelvic junction.  There are multiple additional pelvic phleboliths. Normal bowel gas pattern, small volume of stool in the colon. IUD in the pelvis. IMPRESSION: Previous left ureteral stone is not definitively seen in the ureter. Stone is potentially in the pelvis in the region of the ureteropelvic junction. Electronically Signed   By: Narda Rutherford M.D.   On: 09/06/2023 15:56   DG Abd 1 View Result Date: 09/06/2023 CLINICAL DATA:  Left ureteral stone. EXAM: ABDOMEN - 1 VIEW COMPARISON:  CT 09/01/2023 FINDINGS: Previous left ureteral stone is not definitively seen in the ureter. Stone is potentially in the pelvis in the region of the ureteropelvic junction. There are multiple additional pelvic phleboliths. Normal bowel gas pattern, small volume of stool in the colon. IUD in the pelvis. IMPRESSION: Previous left ureteral stone is not definitively seen in the ureter. Stone is potentially in the pelvis in the region of the ureteropelvic junction. Electronically Signed   By: Narda Rutherford M.D.   On: 09/06/2023 15:56   CT Renal Stone Study Result Date: 09/01/2023 CLINICAL DATA:  Left flank pain x6 days. EXAM: CT ABDOMEN AND PELVIS WITHOUT CONTRAST TECHNIQUE: Multidetector CT imaging of the abdomen and pelvis was performed following the standard protocol without IV contrast. RADIATION DOSE REDUCTION: This exam was performed according to the departmental dose-optimization program which includes automated exposure control, adjustment of the mA and/or kV according to patient size and/or use of iterative reconstruction technique. COMPARISON:  None Available. FINDINGS: Lower chest: A right breast implant is noted. No acute abnormality is identified. Hepatobiliary: Very small foci of parenchymal low attenuation are seen within the right and left lobes of the liver. The largest is seen within the anteromedial aspect of the left lobe and measures 7 mm (axial CT image 20, CT series 2). No gallstones, gallbladder wall thickening, or  biliary dilatation. Pancreas: Unremarkable. No pancreatic ductal dilatation or surrounding inflammatory changes. Spleen: Normal in size without focal abnormality. Adrenals/Urinary Tract: Adrenal glands are unremarkable. Kidneys are normal in size without focal lesions. A 3 mm obstructing renal calculus is seen within the proximal to mid left ureter, with mild left-sided hydronephrosis and hydroureter. Several punctate nonobstructing renal calculi are seen within the mid and lower left kidney. The urinary bladder is poorly distended and subsequently limited in evaluation. Stomach/Bowel: Stomach is within normal limits. Appendix appears normal. No evidence of bowel wall thickening, distention, or inflammatory changes. Vascular/Lymphatic: Mild aortic atherosclerosis. No enlarged abdominal or pelvic lymph nodes. Reproductive: The uterus is retroverted in position and contains a probably positioned IUD. The bilateral adnexa are unremarkable. Other: No abdominal wall hernia or abnormality. No abdominopelvic ascites. Musculoskeletal: No acute or significant osseous findings. IMPRESSION: 1. 3 mm obstructing renal calculus within the proximal to mid left ureter. 2. Several punctate nonobstructing left renal calculi. 3. Retroverted uterus with a probably positioned IUD. 4. Aortic atherosclerosis. Aortic Atherosclerosis (ICD10-I70.0). Electronically Signed   By: Aram Candela M.D.   On: 09/01/2023 21:41     Assessment/Plan: Left ureteral stone.  She is having urgency now so the stone is likely at the UVJ.  I will get a KUB today and if that is the case, I have recommended that she try  to pass the stone.  I have given her Gemtesa to help with the urgency and discussed the return precautions.  I will have her return in 2 weeks with another KUB since she is scheduled for a hysterectomy and prolapse repair on 09/24/23 at [facility] and we may need to coordinate ureteroscopy if the stone hasn't passed.   Risks of that procedure  reviewed.   Meds ordered this encounter  Medications   Vibegron (GEMTESA) 75 MG TABS    Sig: Take 1 tablet (75 mg total) by mouth daily.    Dispense:  14 tablet    Refill:  0     Orders Placed This Encounter  Procedures   DG Abd 1 View    Standing Status:   Future    Expected Date:   09/06/2023    Expiration Date:   12/05/2023    Reason for Exam (SYMPTOM  OR DIAGNOSIS REQUIRED):   left ureteral stone    Preferred imaging location?:   [facility]    Radiology Contrast Protocol - do NOT remove file path:   [email or web address]    Is patient pregnant?:   No   DG Abd 1 View    Standing Status:   Future    Number of Occurrences:   1    Expected Date:   09/20/2023    Expiration Date:   12/05/2023    Reason for Exam (SYMPTOM  OR DIAGNOSIS REQUIRED):   f/u of left ureteral stone    Preferred imaging location?:   [facility]    Radiology Contrast Protocol - do NOT remove file path:   [email or web address]    Is patient pregnant?:   No   Urinalysis, Routine w reflex microscopic     Return in about 2 weeks (around 09/20/2023) for with sarah.  .    CC: Dr. Lilyan Punt and Dr. Fonnie Birkenhead.      Bjorn Pippin 09/07/2023

## 2023-09-07 ENCOUNTER — Other Ambulatory Visit: Payer: Self-pay | Admitting: Family Medicine

## 2023-09-07 ENCOUNTER — Other Ambulatory Visit: Payer: Self-pay

## 2023-09-07 DIAGNOSIS — I700 Atherosclerosis of aorta: Secondary | ICD-10-CM

## 2023-09-07 NOTE — Telephone Encounter (Signed)
Nurses Please go ahead and order Lipid profile, glucose, lipoprotein A, CRP  Diagnosis aortic atherosclerosis  She can do these fasting at her convenience.  When I get the results I can connect with her.  Thanks-Dr. Lorin Picket

## 2023-09-10 DIAGNOSIS — I700 Atherosclerosis of aorta: Secondary | ICD-10-CM | POA: Diagnosis not present

## 2023-09-12 LAB — LIPOPROTEIN A (LPA): Lipoprotein (a): 11.2 nmol/L (ref <75.0)

## 2023-09-12 LAB — LIPID PANEL
Chol/HDL Ratio: 2.5 {ratio} (ref 0.0–4.4)
Cholesterol, Total: 170 mg/dL (ref 100–199)
HDL: 68 mg/dL (ref >39)
LDL Chol Calc (NIH): 93 mg/dL (ref 0–99)
Triglycerides: 41 mg/dL (ref 0–149)
VLDL Cholesterol Cal: 9 mg/dL (ref 5–40)

## 2023-09-12 LAB — C-REACTIVE PROTEIN: CRP: 4 mg/L (ref 0–10)

## 2023-09-12 LAB — GLUCOSE, RANDOM: Glucose: 75 mg/dL (ref 70–99)

## 2023-09-13 ENCOUNTER — Ambulatory Visit (HOSPITAL_COMMUNITY)
Admission: RE | Admit: 2023-09-13 | Discharge: 2023-09-13 | Disposition: A | Discharge status: Home / Self Care | Payer: BC Managed Care – PPO | Source: Ambulatory Visit | Attending: Urology | Admitting: Urology

## 2023-09-13 ENCOUNTER — Encounter: Payer: Self-pay | Admitting: Urology

## 2023-09-13 ENCOUNTER — Ambulatory Visit: Payer: BC Managed Care – PPO | Admitting: Urology

## 2023-09-13 VITALS — BP 127/86 | HR 96 | Temp 98.8°F

## 2023-09-13 DIAGNOSIS — Z09 Encounter for follow-up examination after completed treatment for conditions other than malignant neoplasm: Secondary | ICD-10-CM | POA: Diagnosis not present

## 2023-09-13 DIAGNOSIS — Z87442 Personal history of urinary calculi: Secondary | ICD-10-CM | POA: Diagnosis not present

## 2023-09-13 DIAGNOSIS — N201 Calculus of ureter: Secondary | ICD-10-CM | POA: Diagnosis not present

## 2023-09-13 DIAGNOSIS — N200 Calculus of kidney: Secondary | ICD-10-CM | POA: Insufficient documentation

## 2023-09-13 LAB — URINALYSIS, ROUTINE W REFLEX MICROSCOPIC
Bilirubin, UA: NEGATIVE
Glucose, UA: NEGATIVE
Ketones, UA: NEGATIVE
Leukocytes,UA: NEGATIVE
Nitrite, UA: NEGATIVE
Protein,UA: NEGATIVE
RBC, UA: NEGATIVE
Specific Gravity, UA: 1.025 (ref 1.005–1.030)
Urobilinogen, Ur: 0.2 mg/dL (ref 0.2–1.0)
pH, UA: 6 (ref 5.0–7.5)

## 2023-09-13 NOTE — Patient Instructions (Signed)

## 2023-09-17 ENCOUNTER — Ambulatory Visit: Payer: BC Managed Care – PPO | Admitting: Internal Medicine

## 2023-09-17 ENCOUNTER — Encounter: Payer: Self-pay | Admitting: Internal Medicine

## 2023-09-17 VITALS — BP 120/70 | HR 73 | Ht 61.0 in | Wt 122.0 lb

## 2023-09-17 DIAGNOSIS — E221 Hyperprolactinemia: Secondary | ICD-10-CM | POA: Diagnosis not present

## 2023-09-17 DIAGNOSIS — D352 Benign neoplasm of pituitary gland: Secondary | ICD-10-CM

## 2023-09-17 MED ORDER — CABERGOLINE 0.5 MG PO TABS: 0.2500 mg | ORAL_TABLET | ORAL | 3 refills | Status: DC

## 2023-09-17 NOTE — Progress Notes (Unsigned)
Name: Briana Valdez  MRN/ DOB: [id number], [birth year]    Age/ Sex: 43 y.o., female     PCP: Babs Sciara, MD   Reason for Endocrinology Evaluation: Pituitary adenoma     Initial Endocrinology Clinic Visit: 04/04/2021    PATIENT IDENTIFIER: Briana Valdez is a 43 y.o., female with a past medical history of pituitary adenoma. She has followed with [facility] since 04/04/2021 for consultative assistance with management of her pituitary adenoma.   HISTORICAL SUMMARY: The patient was first diagnosed with pituitary adenoma in 2018.  She has slight elevation of prolactin in the past as well as normal ophthalmological exam  She had left intermittent galactorrhea Prolactin level was 27.8 NG/mL in June 2022, patient was started on cabergoline She uses an IUD and rarely gets any menstruations  Has 3 biological boys   SUBJECTIVE:    Today (09/17/2023):  Briana Valdez is here for pituitary adenoma and prolactinoma.  Patient has been evaluated by urology for a 3 mm left kidney stone, serum calcium normal She is scheduled for hysterectomy 09/2022 She denies any constipation or diarrhea Denies visual changes, up-to-date on eye exams Denies galactorrhea  No headaches   She was on Gabapentin for anxiety, but she weaned herself off  of it   Cabergoline 0.5 mg , half a tab once weekly    HISTORY:  Past Medical History:  Past Medical History:  Diagnosis Date   Benign tumor of pituitary gland [facility])    Past Surgical History:  Past Surgical History:  Procedure Laterality Date   AUGMENTATION MAMMAPLASTY Bilateral 2010   COLONOSCOPY WITH PROPOFOL N/A 10/18/2020   Non-bleeding internal hemorrhoids, otherwise normal.    PLACEMENT OF BREAST IMPLANTS     Social History:  reports that she has never smoked. She has never used smokeless tobacco. She reports that she does not drink alcohol and does not use drugs. Family History:  Family History  Problem Relation Age of  Onset   Breast cancer Mother    Cancer Mother        breast   Diabetes Father    Hypertension Father    Liver disease Father    Breast cancer Maternal Grandmother    Breast cancer Paternal Grandmother    Colon cancer Paternal Grandmother    Colon polyps Neg Hx    Other Neg Hx      HOME MEDICATIONS: Allergies as of 09/17/2023   No Known Allergies      Medication List        Accurate as of September 17, 2023  9:17 AM. If you have any questions, ask your nurse or doctor.          cabergoline 0.5 MG tablet Commonly known as: DOSTINEX Take 0.5 tablets (0.25 mg total) by mouth once a week.          OBJECTIVE:   PHYSICAL EXAM: VS: BP 120/70 (BP Location: Left Arm, Patient Position: Sitting, Cuff Size: Small)   Pulse 73   Ht 5\' 1"  (1.549 m)   Wt 122 lb (55.3 kg)   SpO2 99%   BMI 23.05 kg/m    EXAM: General: Pt appears well and is in NAD  Neck: General: Supple without adenopathy. Thyroid: Thyroid size normal.  No goiter or nodules appreciated.   Lungs: Clear with good BS bilat   Heart: Auscultation: RRR.  Abdomen:  soft, nontender  Extremities:  BL LE: No pretibial edema   Mental Status: Judgment, insight: Intact  Orientation: Oriented to time, place, and person Mood and affect: No depression, anxiety, or agitation     DATA REVIEWED:   Latest Reference Range & Units 09/17/23 09:48  Prolactin ng/mL 12.2     Latest Reference Range & Units 03/12/23 11:02  Sodium 135 - 145 mEq/L 139  Potassium 3.5 - 5.1 mEq/L 4.1  Chloride 96 - 112 mEq/L 103  CO2 19 - 32 mEq/L 27  Glucose 70 - 99 mg/dL 77  BUN 6 - 23 mg/dL 11  Creatinine 2.45 - 8.09 mg/dL 9.83  Calcium 8.4 - 38.2 mg/dL 9.4  Alkaline Phosphatase 39 - 117 U/L 30 (L)  Albumin 3.5 - 5.2 g/dL 4.4  AST 0 - 37 U/L 17  ALT 0 - 35 U/L 15  Total Protein 6.0 - 8.3 g/dL 7.5  Total Bilirubin 0.2 - 1.2 mg/dL 0.5  GFR >50.53 mL/min 111.29    Latest Reference Range & Units 03/12/23 11:02  Prolactin ng/mL 9.3   Glucose 70 - 99 mg/dL 77  TSH 9.76 - 7.34 uIU/mL 0.63  T4,Free(Direct) 0.60 - 1.60 ng/dL 1.93      MRI Brain 7/90/2409  Brain: No acute infarction, hemorrhage, hydrocephalus or extra-axial collection.   Pituitary/Sella: Stable appearance of hypoenhancing lesion on the left side of the pituitary gland measuring approximately 9 x 7 x 5 mm. This results in a convex contour of the pituitary gland extending into the suprasellar region and abutting the optic chiasm. The pituitary stalk is deviated to the right. No evidence of cavernous sinus invasion. The hypothalamus and mamillary bodies are normal. The infundibular and chiasmatic recesses are clear. Normal internal carotid artery flow voids.   Vascular: Normal flow voids.   Skull and upper cervical spine: Normal marrow signal.   Sinuses/Orbits: Negative.   Other: None.   IMPRESSION: Stable appearance of hypoenhancing lesion of the left side of the pituitary gland, consistent with a pituitary microadenoma.       ASSESSMENT / PLAN / RECOMMENDATIONS:   Pituitary Microadenoma:  - Brain MRI showed pituitary adenoma 9 x8x5 mm in 2022  -Repeat MRI 09/2022 showed stable appearance of hypoenhancing lesion of the left side of the pituitary gland. -No local symptoms -Up-to-date on eye exam  2.  Hyperprolactinemia:  -Prolactin has been normal since being on cabergoline -Repeat prolactin is normal    Medications  Continue Cabergoline 0.5 mg, half a tablet once  weekly   Follow-up in 1 yr    Signed electronically by: Lyndle Herrlich, MD  [facility]  [facility] [address] [place] [place], [place] [address] Phone: [phone] FAX: [phone]      CC: Babs Sciara, MD [address] [place] [address] Phone: [phone]  Fax: [phone]   Return to Endocrinology clinic as below: Future Appointments  Date Time Provider Department Center  10/19/2023   8:30 AM [facility] MM 3 [facility]  11/06/2023  8:20 AM Marguerita Beards, MD [facility]  11/26/2023  9:00 AM Babs Sciara, MD [facility]  03/24/2024  9:30 AM Donnita Falls, FNP [facility] None

## 2023-09-18 ENCOUNTER — Encounter: Payer: Self-pay | Admitting: Family Medicine

## 2023-09-18 ENCOUNTER — Encounter: Payer: Self-pay | Admitting: Internal Medicine

## 2023-09-18 LAB — PROLACTIN: Prolactin: 12.2 ng/mL

## 2023-09-18 MED ORDER — CABERGOLINE 0.5 MG PO TABS: 0.2500 mg | ORAL_TABLET | ORAL | 3 refills | Status: AC

## 2023-09-19 ENCOUNTER — Encounter (HOSPITAL_BASED_OUTPATIENT_CLINIC_OR_DEPARTMENT_OTHER): Payer: Self-pay | Admitting: Obstetrics and Gynecology

## 2023-09-19 ENCOUNTER — Other Ambulatory Visit: Payer: Self-pay | Admitting: Obstetrics

## 2023-09-19 DIAGNOSIS — Z1231 Encounter for screening mammogram for malignant neoplasm of breast: Secondary | ICD-10-CM

## 2023-09-19 NOTE — Progress Notes (Signed)
Your procedure is scheduled on :  Monday ,  09-24-2023  Report to [facility] AT  _9:00__ AM.   Call this number if you have problems the morning of surgery:  [phone] Any questions prior to surgery call pre-op nurse,  Ikey Omary:  [phone]   OUR ADDRESS IS [address].  WE ARE LOCATED IN THE [facility]  PLEASE BRING YOUR INSURANCE CARD AND PHOTO ID DAY OF SURGERY.                                     REMEMBER:  Do not eat food after midnight night before surgery.  You may have clear liquid diet from midnight night before surgery until 8:00 AM.   NO clear liquids after 8:00 AM  day of surgery.  This includes no water,  candy,  gum,  and  mints.   Please brush your teeth morning of surgery and rinse mouth out.    CLEAR LIQUID DIET Allowed      Water                                                                   Coffee and tea, regular and decaf  (NO cream or milk products of any type, may sweeten,  no honey)                         Carbonated beverages, regular and diet                                    Sports drinks like Gatorade _____________________________________________________________________     TAKE ONLY THESE MEDICATIONS MORNING OF SURGERY: None                                        DO NOT WEAR JEWERLY/  METAL/  PIERCINGS (INCLUDING NO PLASTIC PIERCINGS) DO NOT WEAR LOTIONS, POWDERS, PERFUMES OR NAIL POLISH ON YOUR FINGERNAILS. TOENAIL POLISH IS OK TO WEAR. DO NOT SHAVE FOR 48 HOURS PRIOR TO DAY OF SURGERY.  CONTACTS, GLASSES, OR DENTURES MAY NOT BE WORN TO SURGERY.  REMEMBER: NO SMOKING, VAPING ,  DRUGS OR ALCOHOL FOR 24 HOURS BEFORE YOUR SURGERY.                                    [facility] IS NOT RESPONSIBLE  FOR ANY BELONGINGS.                                                                    Marland Kitchen           [place] - Preparing for Surgery Before  surgery, you can play an important role.  Because  skin is not sterile, your skin needs to be as free of germs as possible.  You can reduce the number of germs on your skin by washing with CHG (chlorahexidine gluconate) soap before surgery.  CHG is an antiseptic cleaner which kills germs and bonds with the skin to continue killing germs even after washing. Please DO NOT use if you have an allergy to CHG or antibacterial soaps.  If your skin becomes reddened/irritated stop using the CHG and inform your nurse when you arrive at Short Stay. Do not shave (including legs and underarms) for at least 48 hours prior to the first CHG shower.  You may shave your face/neck. Please follow these instructions carefully:  1.  Shower with CHG Soap the night before surgery and the  morning of Surgery.  2.  If you choose to wash your hair, wash your hair first as usual with your  normal  shampoo.  3.  After you shampoo, rinse your hair and body thoroughly to remove the  shampoo.                                        4.  Use CHG as you would any other liquid soap.  You can apply chg directly  to the skin and wash , chg soap provided, night before and morning of your surgery.  5.  Apply the CHG Soap to your body ONLY FROM THE NECK DOWN.   Do not use on face/ open                           Wound or open sores. Avoid contact with eyes, ears mouth and genitals (private parts).                       Wash face,  Genitals (private parts) with your normal soap.             6.  Wash thoroughly, paying special attention to the area where your surgery  will be performed.  7.  Thoroughly rinse your body with warm water from the neck down.  8.  DO NOT shower/wash with your normal soap after using and rinsing off  the CHG Soap.             9.  Pat yourself dry with a clean towel.            10.  Wear clean pajamas.            11.  Place clean sheets on your bed the night of your first shower and do not  sleep with pets. Day of Surgery : Do not apply any lotions/ powders the  morning of surgery.  Please wear clean clothes to the hospital/surgery center.  IF YOU HAVE ANY SKIN IRRITATION OR PROBLEMS WITH THE SURGICAL SOAP, PLEASE GET A BAR OF GOLD DIAL SOAP AND SHOWER THE NIGHT BEFORE YOUR SURGERY AND THE MORNING OF YOUR SURGERY. PLEASE LET THE NURSE KNOW MORNING OF YOUR SURGERY IF YOU HAD ANY PROBLEMS WITH THE SURGICAL SOAP.   YOUR SURGEON MAY HAVE REQUESTED EXTENDED RECOVERY TIME AFTER YOUR SURGERY. IT COULD BE A  JUST A FEW HOURS  UP TO AN OVERNIGHT STAY.  YOUR SURGEON SHOULD HAVE DISCUSSED THIS WITH YOU PRIOR TO YOUR  SURGERY. IN THE EVENT YOU NEED TO STAY OVERNIGHT PLEASE REFER TO THE FOLLOWING GUIDELINES. YOU MAY HAVE UP TO 4 VISITORS  MAY VISIT IN THE EXTENDED RECOVERY ROOM UNTIL 800 PM ONLY.  ONE  VISITOR AGE 48 AND OVER MAY SPEND THE NIGHT AND MUST BE IN EXTENDED RECOVERY ROOM NO LATER THAN 800 PM . YOUR DISCHARGE TIME AFTER YOU SPEND THE NIGHT IS 900 AM THE MORNING AFTER YOUR SURGERY. YOU MAY PACK A SMALL OVERNIGHT BAG WITH TOILETRIES FOR YOUR OVERNIGHT STAY IF YOU WISH.  REGARDLESS OF IF YOU STAY OVER NIGHT OR ARE DISCHARGED THE SAME DAY YOU WILL BE REQUIRED TO HAVE A RESPONSIBLE ADULT (18 YRS OLD OR OLDER) STAY WITH YOU FOR AT LEAST THE FIRST 64 HOURS WHEN HOME.  YOUR PRESCRIPTION MEDICATIONS WILL BE PROVIDED DURING [facility] STAY.  ________________________________________________________________________

## 2023-09-19 NOTE — Progress Notes (Addendum)
Spoke w/ via phone for pre-op interview--- pt Lab needs dos---- cbc, t&s, urine preg     (unable to come prior to surgery, working, and stated was unaware she to have a lab appointment prior to surgery)   Lab results------ no COVID test -----patient states asymptomatic no test needed Arrive at -------  0900 on 09-24-2023 NPO after MN NO Solid Food.  Clear liquids from MN until--- 0800 Med rec completed Medications to take morning of surgery ----- none Diabetic medication ----- n/a Patient instructed no nail polish to be worn day of surgery Patient instructed to bring photo id and insurance card day of surgery Patient aware to have Driver (ride ) / caregiver    for 24 hours after surgery - husband, billy Patient Special Instructions ----- patient unable to come in for lab appointment or come in to pick up hibiclens / written instructions. pt verbalized understanding to obtain hibiclens at any pharmacy and shower night before surgery and morning of surgery, chin to toe, with exception not the genital area (use own soap for this area).  Pt stated she has MyChart , however, per email message since this morning IT is working on MyChart so not available to send written instructions to patient.  Told patient when MyChart is working will send to her and get in touch with her by phone to confirm she received them. Pre-Op special Instructions ----- patient verbalized understanding to have permanent bracelets removed prior to arrival [facility] Patient verbalized understanding of instructions that were given at this phone interview. Patient denies chest pain, sob, fever, cough at the interview.

## 2023-09-20 ENCOUNTER — Encounter (HOSPITAL_COMMUNITY): Payer: Self-pay

## 2023-09-20 NOTE — Telephone Encounter (Signed)
Front staff Please work with the patient to place her into a same-day slot the week of February 17 or 24 so we can discuss her health issues in more detail  She is having surgery February 3 and in my opinion it would be wise to wait a couple weeks before doing a office visit to allow her to get over the surgery to some degree thank you-Dr. Lorin Picket

## 2023-09-21 NOTE — Progress Notes (Signed)
Patient notified of time change and will be here at 0530 on Monday with clear liquids until 0430. Verbalizes understanding.

## 2023-09-22 NOTE — Anesthesia Preprocedure Evaluation (Signed)
Anesthesia Evaluation  Patient identified by MRN, date of birth, ID band Patient awake    Reviewed: Allergy & Precautions, H&P , NPO status , Patient's Chart, lab work & pertinent test results  History of Anesthesia Complications (+) PONV and history of anesthetic complications  Airway Mallampati: II  TM Distance: >3 FB Neck ROM: Full    Dental no notable dental hx. (+) Teeth Intact   Pulmonary neg pulmonary ROS   Pulmonary exam normal breath sounds clear to auscultation       Cardiovascular Exercise Tolerance: Good negative cardio ROS  Rhythm:Regular Rate:Normal     Neuro/Psych negative neurological ROS  negative psych ROS   GI/Hepatic negative GI ROS, Neg liver ROS,,,  Endo/Other  negative endocrine ROS    Renal/GU negative Renal ROS  negative genitourinary   Musculoskeletal   Abdominal   Peds  Hematology negative hematology ROS (+)   Anesthesia Other Findings   Reproductive/Obstetrics negative OB ROS                             Anesthesia Physical Anesthesia Plan  ASA: 2  Anesthesia Plan: General   Post-op Pain Management: Tylenol PO (pre-op)* and Toradol IV (intra-op)*   Induction: Intravenous  PONV Risk Score and Plan: 4 or greater and Ondansetron, Dexamethasone, Midazolam, Scopolamine patch - Pre-op, Propofol infusion and TIVA  Airway Management Planned: Oral ETT  Additional Equipment:   Intra-op Plan:   Post-operative Plan: Extubation in OR  Informed Consent: I have reviewed the patients History and Physical, chart, labs and discussed the procedure including the risks, benefits and alternatives for the proposed anesthesia with the patient or authorized representative who has indicated his/her understanding and acceptance.     Dental advisory given  Plan Discussed with: CRNA  Anesthesia Plan Comments:        Anesthesia Quick Evaluation

## 2023-09-23 ENCOUNTER — Telehealth: Payer: Self-pay | Admitting: Family Medicine

## 2023-09-23 NOTE — Telephone Encounter (Signed)
Patient has upcoming appointment with [place] we will discuss her most recent CAT scan findings in more detail I did touch base with Dr. Marletta Lor who stated that we could do an MRI with and without if we wanted further delineation or they could see her in the office  We will discuss this further at her follow-up visit within the next 2 weeks thank you

## 2023-09-24 ENCOUNTER — Other Ambulatory Visit (HOSPITAL_COMMUNITY): Payer: Self-pay

## 2023-09-24 ENCOUNTER — Other Ambulatory Visit: Payer: Self-pay

## 2023-09-24 ENCOUNTER — Ambulatory Visit (HOSPITAL_BASED_OUTPATIENT_CLINIC_OR_DEPARTMENT_OTHER): Payer: Self-pay | Admitting: Anesthesiology

## 2023-09-24 ENCOUNTER — Ambulatory Visit (HOSPITAL_BASED_OUTPATIENT_CLINIC_OR_DEPARTMENT_OTHER)
Admission: RE | Admit: 2023-09-24 | Discharge: 2023-09-24 | Disposition: A | Discharge status: Home / Self Care | Payer: BC Managed Care – PPO | Source: Ambulatory Visit | Attending: Obstetrics and Gynecology | Admitting: Obstetrics and Gynecology

## 2023-09-24 ENCOUNTER — Telehealth: Payer: Self-pay | Admitting: Obstetrics and Gynecology

## 2023-09-24 ENCOUNTER — Other Ambulatory Visit: Payer: Self-pay | Admitting: Obstetrics and Gynecology

## 2023-09-24 ENCOUNTER — Encounter (HOSPITAL_BASED_OUTPATIENT_CLINIC_OR_DEPARTMENT_OTHER)
Admission: RE | Disposition: A | Discharge status: Home / Self Care | Payer: Self-pay | Source: Ambulatory Visit | Attending: Obstetrics and Gynecology

## 2023-09-24 ENCOUNTER — Encounter (HOSPITAL_BASED_OUTPATIENT_CLINIC_OR_DEPARTMENT_OTHER): Payer: Self-pay | Admitting: Obstetrics and Gynecology

## 2023-09-24 DIAGNOSIS — D252 Subserosal leiomyoma of uterus: Secondary | ICD-10-CM | POA: Diagnosis not present

## 2023-09-24 DIAGNOSIS — N838 Other noninflammatory disorders of ovary, fallopian tube and broad ligament: Secondary | ICD-10-CM | POA: Diagnosis not present

## 2023-09-24 DIAGNOSIS — D251 Intramural leiomyoma of uterus: Secondary | ICD-10-CM | POA: Insufficient documentation

## 2023-09-24 DIAGNOSIS — N888 Other specified noninflammatory disorders of cervix uteri: Secondary | ICD-10-CM | POA: Diagnosis not present

## 2023-09-24 DIAGNOSIS — G8918 Other acute postprocedural pain: Secondary | ICD-10-CM

## 2023-09-24 DIAGNOSIS — Z01818 Encounter for other preprocedural examination: Secondary | ICD-10-CM

## 2023-09-24 DIAGNOSIS — N393 Stress incontinence (female) (male): Secondary | ICD-10-CM | POA: Diagnosis not present

## 2023-09-24 DIAGNOSIS — D250 Submucous leiomyoma of uterus: Secondary | ICD-10-CM | POA: Diagnosis not present

## 2023-09-24 DIAGNOSIS — N812 Incomplete uterovaginal prolapse: Secondary | ICD-10-CM | POA: Diagnosis not present

## 2023-09-24 DIAGNOSIS — N813 Complete uterovaginal prolapse: Secondary | ICD-10-CM

## 2023-09-24 HISTORY — DX: Stress incontinence (female) (male): N39.3

## 2023-09-24 HISTORY — DX: Incomplete uterovaginal prolapse: N81.2

## 2023-09-24 HISTORY — PX: CYSTOSCOPY: SHX5120

## 2023-09-24 HISTORY — DX: Other specified postprocedural states: Z98.890

## 2023-09-24 HISTORY — PX: VAGINAL PROLAPSE REPAIR: SHX830

## 2023-09-24 HISTORY — PX: VAGINAL HYSTERECTOMY: SHX2639

## 2023-09-24 HISTORY — PX: ANTERIOR AND POSTERIOR REPAIR: SHX5121

## 2023-09-24 HISTORY — DX: Unspecified hemorrhoids: K64.9

## 2023-09-24 HISTORY — DX: Nausea with vomiting, unspecified: R11.2

## 2023-09-24 LAB — TYPE AND SCREEN
ABO/RH(D): A POS
Antibody Screen: NEGATIVE

## 2023-09-24 LAB — POCT PREGNANCY, URINE: Preg Test, Ur: NEGATIVE

## 2023-09-24 LAB — CBC
HCT: 44.3 % (ref 36.0–46.0)
Hemoglobin: 14.9 g/dL (ref 12.0–15.0)
MCH: 32.1 pg (ref 26.0–34.0)
MCHC: 33.6 g/dL (ref 30.0–36.0)
MCV: 95.5 fL (ref 80.0–100.0)
Platelets: 245 10*3/uL (ref 150–400)
RBC: 4.64 MIL/uL (ref 3.87–5.11)
RDW: 12.4 % (ref 11.5–15.5)
WBC: 7 10*3/uL (ref 4.0–10.5)
nRBC: 0 % (ref 0.0–0.2)

## 2023-09-24 LAB — ABO/RH: ABO/RH(D): A POS

## 2023-09-24 SURGERY — HYSTERECTOMY, VAGINAL
Anesthesia: General | Site: Vagina

## 2023-09-24 MED ORDER — SODIUM CHLORIDE 0.9 % IV SOLN: INTRAVENOUS | Status: DC

## 2023-09-24 MED ORDER — SIMETHICONE 80 MG PO CHEW: 80.0000 mg | CHEWABLE_TABLET | Freq: Four times a day (QID) | ORAL | Status: DC | PRN

## 2023-09-24 MED ORDER — KETOROLAC TROMETHAMINE 30 MG/ML IJ SOLN: 30.0000 mg | Freq: Four times a day (QID) | INTRAMUSCULAR | Status: DC

## 2023-09-24 MED ORDER — PHENAZOPYRIDINE HCL 100 MG PO TABS
ORAL_TABLET | ORAL | Status: AC
  Filled 2023-09-24: qty 2

## 2023-09-24 MED ORDER — 0.9 % SODIUM CHLORIDE (POUR BTL) OPTIME
TOPICAL | Status: DC | PRN
  Administered 2023-09-24: 500 mL

## 2023-09-24 MED ORDER — DEXAMETHASONE SODIUM PHOSPHATE 10 MG/ML IJ SOLN
INTRAMUSCULAR | Status: DC | PRN
  Administered 2023-09-24: 10 mg via INTRAVENOUS

## 2023-09-24 MED ORDER — LIDOCAINE 2% (20 MG/ML) 5 ML SYRINGE
INTRAMUSCULAR | Status: DC | PRN
  Administered 2023-09-24: 60 mg via INTRAVENOUS

## 2023-09-24 MED ORDER — MIDAZOLAM HCL 2 MG/2ML IJ SOLN
INTRAMUSCULAR | Status: AC
  Filled 2023-09-24: qty 2

## 2023-09-24 MED ORDER — GABAPENTIN 300 MG PO CAPS
300.0000 mg | ORAL_CAPSULE | ORAL | Status: AC
Start: 2023-09-24 — End: 2023-09-24
  Administered 2023-09-24: 300 mg via ORAL

## 2023-09-24 MED ORDER — LIDOCAINE HCL (PF) 2 % IJ SOLN
INTRAMUSCULAR | Status: AC
  Filled 2023-09-24: qty 5

## 2023-09-24 MED ORDER — PROPOFOL 10 MG/ML IV BOLUS
INTRAVENOUS | Status: DC | PRN
  Administered 2023-09-24: 120 mg via INTRAVENOUS
  Administered 2023-09-24: 30 mg via INTRAVENOUS

## 2023-09-24 MED ORDER — KETOROLAC TROMETHAMINE 30 MG/ML IJ SOLN
INTRAMUSCULAR | Status: DC | PRN
  Administered 2023-09-24: 30 mg via INTRAVENOUS

## 2023-09-24 MED ORDER — ACETAMINOPHEN 500 MG PO TABS
1000.0000 mg | ORAL_TABLET | ORAL | Status: AC
  Administered 2023-09-24: 1000 mg via ORAL

## 2023-09-24 MED ORDER — FENTANYL CITRATE (PF) 100 MCG/2ML IJ SOLN
INTRAMUSCULAR | Status: DC | PRN
  Administered 2023-09-24: 100 ug via INTRAVENOUS
  Administered 2023-09-24: 50 ug via INTRAVENOUS

## 2023-09-24 MED ORDER — DEXMEDETOMIDINE HCL IN NACL 80 MCG/20ML IV SOLN
INTRAVENOUS | Status: DC | PRN
  Administered 2023-09-24: 4 ug via INTRAVENOUS

## 2023-09-24 MED ORDER — ONDANSETRON HCL 4 MG/2ML IJ SOLN: 4.0000 mg | Freq: Four times a day (QID) | INTRAMUSCULAR | Status: DC | PRN

## 2023-09-24 MED ORDER — SCOPOLAMINE 1 MG/3DAYS TD PT72
MEDICATED_PATCH | TRANSDERMAL | Status: AC
  Filled 2023-09-24: qty 1

## 2023-09-24 MED ORDER — DEXMEDETOMIDINE HCL IN NACL 80 MCG/20ML IV SOLN
INTRAVENOUS | Status: AC
Start: 2023-09-24 — End: ?
  Filled 2023-09-24: qty 20

## 2023-09-24 MED ORDER — HYDROMORPHONE HCL 1 MG/ML IJ SOLN
INTRAMUSCULAR | Status: AC
  Filled 2023-09-24: qty 1

## 2023-09-24 MED ORDER — OXYCODONE HCL 5 MG PO TABS
5.0000 mg | ORAL_TABLET | ORAL | Status: DC | PRN
Start: 2023-09-24 — End: 2023-09-24

## 2023-09-24 MED ORDER — OXYCODONE HCL 5 MG PO TABS
5.0000 mg | ORAL_TABLET | ORAL | 0 refills | Status: DC | PRN
  Filled 2023-09-24: qty 15, 3d supply, fill #0

## 2023-09-24 MED ORDER — ONDANSETRON HCL 4 MG PO TABS: 4.0000 mg | ORAL_TABLET | Freq: Four times a day (QID) | ORAL | Status: DC | PRN

## 2023-09-24 MED ORDER — IBUPROFEN 200 MG PO TABS: 600.0000 mg | ORAL_TABLET | Freq: Four times a day (QID) | ORAL | Status: DC

## 2023-09-24 MED ORDER — GABAPENTIN 300 MG PO CAPS
ORAL_CAPSULE | ORAL | Status: AC
  Filled 2023-09-24: qty 1

## 2023-09-24 MED ORDER — GLYCOPYRROLATE PF 0.2 MG/ML IJ SOSY
PREFILLED_SYRINGE | INTRAMUSCULAR | Status: DC | PRN
  Administered 2023-09-24: .1 mg via INTRAVENOUS

## 2023-09-24 MED ORDER — PROPOFOL 500 MG/50ML IV EMUL
INTRAVENOUS | Status: DC | PRN
  Administered 2023-09-24: 200 ug/kg/min via INTRAVENOUS

## 2023-09-24 MED ORDER — SCOPOLAMINE 1 MG/3DAYS TD PT72
1.0000 | MEDICATED_PATCH | TRANSDERMAL | Status: DC
  Administered 2023-09-24: 1.5 mg via TRANSDERMAL

## 2023-09-24 MED ORDER — ACETAMINOPHEN 500 MG PO TABS
ORAL_TABLET | ORAL | Status: AC
  Filled 2023-09-24: qty 2

## 2023-09-24 MED ORDER — MIDAZOLAM HCL 5 MG/5ML IJ SOLN
INTRAMUSCULAR | Status: DC | PRN
  Administered 2023-09-24: 2 mg via INTRAVENOUS

## 2023-09-24 MED ORDER — DEXAMETHASONE SODIUM PHOSPHATE 10 MG/ML IJ SOLN
INTRAMUSCULAR | Status: AC
  Filled 2023-09-24: qty 1

## 2023-09-24 MED ORDER — KETOROLAC TROMETHAMINE 30 MG/ML IJ SOLN
INTRAMUSCULAR | Status: AC
  Filled 2023-09-24: qty 1

## 2023-09-24 MED ORDER — PHENYLEPHRINE 80 MCG/ML (10ML) SYRINGE FOR IV PUSH (FOR BLOOD PRESSURE SUPPORT)
PREFILLED_SYRINGE | INTRAVENOUS | Status: DC | PRN
  Administered 2023-09-24: 40 ug via INTRAVENOUS
  Administered 2023-09-24: 80 ug via INTRAVENOUS

## 2023-09-24 MED ORDER — FENTANYL CITRATE (PF) 100 MCG/2ML IJ SOLN
INTRAMUSCULAR | Status: AC
  Filled 2023-09-24: qty 2

## 2023-09-24 MED ORDER — SUGAMMADEX SODIUM 200 MG/2ML IV SOLN
INTRAVENOUS | Status: DC | PRN
  Administered 2023-09-24: 200 mg via INTRAVENOUS

## 2023-09-24 MED ORDER — CEFAZOLIN SODIUM-DEXTROSE 2-4 GM/100ML-% IV SOLN
INTRAVENOUS | Status: AC
  Filled 2023-09-24: qty 100

## 2023-09-24 MED ORDER — LACTATED RINGERS IV SOLN: INTRAVENOUS | Status: DC

## 2023-09-24 MED ORDER — LIDOCAINE-EPINEPHRINE 1 %-1:100000 IJ SOLN
INTRAMUSCULAR | Status: DC | PRN
  Administered 2023-09-24: 27 mL

## 2023-09-24 MED ORDER — ROCURONIUM BROMIDE 10 MG/ML (PF) SYRINGE
PREFILLED_SYRINGE | INTRAVENOUS | Status: DC | PRN
  Administered 2023-09-24: 10 mg via INTRAVENOUS
  Administered 2023-09-24: 60 mg via INTRAVENOUS
  Administered 2023-09-24 (×2): 10 mg via INTRAVENOUS

## 2023-09-24 MED ORDER — PROPOFOL 1000 MG/100ML IV EMUL
INTRAVENOUS | Status: AC
Start: 2023-09-24 — End: ?
  Filled 2023-09-24: qty 200

## 2023-09-24 MED ORDER — ONDANSETRON HCL 4 MG/2ML IJ SOLN
INTRAMUSCULAR | Status: DC | PRN
  Administered 2023-09-24: 4 mg via INTRAVENOUS

## 2023-09-24 MED ORDER — PROPOFOL 10 MG/ML IV BOLUS
INTRAVENOUS | Status: AC
  Filled 2023-09-24: qty 20

## 2023-09-24 MED ORDER — ARTIFICIAL TEARS OPHTHALMIC OINT
TOPICAL_OINTMENT | OPHTHALMIC | Status: AC
  Filled 2023-09-24: qty 3.5

## 2023-09-24 MED ORDER — PHENAZOPYRIDINE HCL 100 MG PO TABS
200.0000 mg | ORAL_TABLET | ORAL | Status: AC
  Administered 2023-09-24: 200 mg via ORAL

## 2023-09-24 MED ORDER — STERILE WATER FOR IRRIGATION IR SOLN
Status: DC | PRN
  Administered 2023-09-24: 500 mL

## 2023-09-24 MED ORDER — HYDROMORPHONE HCL 1 MG/ML IJ SOLN
0.2500 mg | INTRAMUSCULAR | Status: DC | PRN
  Administered 2023-09-24 (×3): 0.25 mg via INTRAVENOUS

## 2023-09-24 MED ORDER — SODIUM CHLORIDE 0.9 % IR SOLN
Status: DC | PRN
  Administered 2023-09-24: 1000 mL via INTRAVESICAL

## 2023-09-24 MED ORDER — PHENYLEPHRINE 80 MCG/ML (10ML) SYRINGE FOR IV PUSH (FOR BLOOD PRESSURE SUPPORT)
PREFILLED_SYRINGE | INTRAVENOUS | Status: AC
  Filled 2023-09-24: qty 10

## 2023-09-24 MED ORDER — ONDANSETRON HCL 4 MG/2ML IJ SOLN
INTRAMUSCULAR | Status: AC
  Filled 2023-09-24: qty 4

## 2023-09-24 MED ORDER — PROPOFOL 1000 MG/100ML IV EMUL
INTRAVENOUS | Status: AC
  Filled 2023-09-24: qty 100

## 2023-09-24 MED ORDER — ACETAMINOPHEN 325 MG PO TABS: 650.0000 mg | ORAL_TABLET | ORAL | Status: DC | PRN

## 2023-09-24 MED ORDER — CEFAZOLIN SODIUM-DEXTROSE 2-4 GM/100ML-% IV SOLN
2.0000 g | INTRAVENOUS | Status: AC
  Administered 2023-09-24: 2 g via INTRAVENOUS

## 2023-09-24 SURGICAL SUPPLY — 55 items
BAG DRAIN URO-CYSTO SKYTR STRL (DRAIN) IMPLANT
BLADE CLIPPER SENSICLIP SURGIC (BLADE) IMPLANT
BLADE SURG 15 STRL LF DISP TIS (BLADE) ×4 IMPLANT
CATH FOLEY 2WAY SLVR 5CC 12FR (CATHETERS) ×4 IMPLANT
CATH ROBINSON RED A/P 12FR (CATHETERS) IMPLANT
COVER MAYO STAND STRL (DRAPES) IMPLANT
DEVICE CAPIO SLIM SINGLE (INSTRUMENTS) IMPLANT
DRAPE HYSTEROSCOPY (MISCELLANEOUS) ×4 IMPLANT
ELECT REM PT RETURN 9FT ADLT (ELECTROSURGICAL) ×3
ELECTRODE REM PT RTRN 9FT ADLT (ELECTROSURGICAL) IMPLANT
GAUZE 4X4 16PLY ~~LOC~~+RFID DBL (SPONGE) ×4 IMPLANT
GLOVE BIOGEL PI IND STRL 6.5 (GLOVE) ×4 IMPLANT
GLOVE BIOGEL PI IND STRL 7.0 (GLOVE) ×4 IMPLANT
GLOVE ECLIPSE 6.0 STRL STRAW (GLOVE) ×4 IMPLANT
GLOVE SURG SS PI 7.5 STRL IVOR (GLOVE) IMPLANT
GLOVE SURG SYN 6.5 ES PF (GLOVE) ×6
GLOVE SURG SYN 6.5 PF PI (GLOVE) IMPLANT
GOWN STRL REUS W/ TWL LRG LVL3 (GOWN DISPOSABLE) IMPLANT
GOWN STRL REUS W/TWL LRG LVL3 (GOWN DISPOSABLE) ×4 IMPLANT
HOLDER FOLEY CATH W/STRAP (MISCELLANEOUS) ×4 IMPLANT
IV NS 1000ML BAXH (IV SOLUTION) IMPLANT
KIT TURNOVER CYSTO (KITS) ×4 IMPLANT
LIGASURE IMPACT 36 18CM CVD LR (INSTRUMENTS) ×4 IMPLANT
MANIFOLD NEPTUNE II (INSTRUMENTS) ×4 IMPLANT
NDL HYPO 22X1.5 SAFETY MO (MISCELLANEOUS) ×4 IMPLANT
NDL MAYO 6 CRC TAPER PT (NEEDLE) IMPLANT
NEEDLE HYPO 22X1.5 SAFETY MO (MISCELLANEOUS) ×3
NEEDLE MAYO 6 CRC TAPER PT (NEEDLE) ×3
NS IRRIG 1000ML POUR BTL (IV SOLUTION) ×4 IMPLANT
NS IRRIG 500ML POUR BTL (IV SOLUTION) IMPLANT
PACK CYSTO (CUSTOM PROCEDURE TRAY) ×4 IMPLANT
PACK VAGINAL WOMENS (CUSTOM PROCEDURE TRAY) ×4 IMPLANT
PAD OB MATERNITY 4.3X12.25 (PERSONAL CARE ITEMS) ×4 IMPLANT
RETRACTOR LONE STAR DISPOSABLE (INSTRUMENTS) ×4 IMPLANT
RETRACTOR STAY HOOK 5MM (MISCELLANEOUS) ×4 IMPLANT
SCRUB CHG 4% DYNA-HEX 4OZ (MISCELLANEOUS) ×4 IMPLANT
SET IRRIG Y TYPE TUR BLADDER L (SET/KITS/TRAYS/PACK) ×4 IMPLANT
SLEEVE SCD COMPRESS KNEE MED (STOCKING) ×4 IMPLANT
SPIKE FLUID TRANSFER (MISCELLANEOUS) IMPLANT
SPONGE T-LAP 18X36 ~~LOC~~+RFID STR (SPONGE) ×4 IMPLANT
SURGIFLO W/THROMBIN 8M KIT (HEMOSTASIS) IMPLANT
SUT ABS MONO DBL WITH NDL 48IN (SUTURE) IMPLANT
SUT PDS PLUS AB 0 CT-2 (SUTURE) ×16 IMPLANT
SUT VIC AB 0 CT1 27XBRD ANBCTR (SUTURE) IMPLANT
SUT VIC AB 0 CT1 27XCR 8 STRN (SUTURE) ×8 IMPLANT
SUT VIC AB 2-0 SH 27XBRD (SUTURE) IMPLANT
SUT VICRYL 2-0 SH 8X27 (SUTURE) ×4 IMPLANT
SYR 10ML LL (SYRINGE) IMPLANT
SYR BULB EAR ULCER 3OZ GRN STR (SYRINGE) ×4 IMPLANT
SYSTEM URETHRAL BULK BULKAMID (Female Continence) IMPLANT
TOWEL OR 17X24 6PK STRL BLUE (TOWEL DISPOSABLE) ×4 IMPLANT
TRAY FOLEY W/BAG SLVR 14FR LF (SET/KITS/TRAYS/PACK) ×4 IMPLANT
TUBE CONNECTING 12X1/4 (SUCTIONS) IMPLANT
UNDERPAD 30X36 HEAVY ABSORB (UNDERPADS AND DIAPERS) ×4 IMPLANT
WATER STERILE IRR 500ML POUR (IV SOLUTION) IMPLANT

## 2023-09-24 NOTE — Op Note (Signed)
Operative Note  Preoperative Diagnosis: anterior vaginal prolapse, posterior vaginal prolapse, uterovaginal prolapse, incomplete, and stress urinary incontinence  Postoperative Diagnosis: same  Procedures performed:  Total vaginal hysterectomy with bilateral salpingectomy, uterosacral ligament suspension, anterior and posterior repair with perineorrhaphy, cystoscopy, urethral bulking (Bulkamid)  Implants:  Implant Name Type Inv. Item Serial No. Manufacturer Lot No. LRB No. Used Action  SYSTEM Ginny Forth [id number] Female Continence SYSTEM URETHRAL [employer] [id number] N/A 1 Implanted    Attending Surgeon: Lanetta Inch, MD  Assistant: Donne Hazel, RNFA  Anesthesia: General endotracheal  Findings: 1. On vaginal exam, stage III prolapse present  2. On cystoscopy, normal bladder and urethral mucosa without injury or lesion. Brisk bilateral ureteral efflux present.     Specimens:  ID Type Source Tests Collected by Time Destination  1 : cervix, uterus, bilateral fallopian tubes Tissue PATH Gyn biopsy SURGICAL PATHOLOGY Marguerita Beards, MD 09/24/2023 [phone]     Estimated blood loss: 100 mL  IV fluids: 1300 mL  Urine output: 150 mL  Complications: none  Procedure in Detail:  After informed consent was obtained, the patient was taken to the operating room where she was placed under anesthesia.  She was then placed in the dorsal lithotomy position with Allen stirrups and prepped and draped in the usual sterile fashion.  Care was taken to avoid hyperflexion or hyperextension of her lower extremities.    A self-retaining retractor was placed, and a foley catheter was placed. The cervix was grasped with two tenacula.  The cervix was injected circumferentially with 1% lidocaine with epinephrine. A 10 blade was used to incise circumferentially around the cervix.  The posterior vagina was grasped with a Kocher clamp. The Mayo scissors were used to  enter the posterior cul-de-sac. Palpation confirmed peritoneal entry and no adhesions. The posterior peritoneum was affixed to the vaginal cuff with 0-Vicryl (this suture was used throughout unless otherwise specified) at the midline. A right angle retractor was placed through the posterior colpotomy. A Heaney clamp was then used to clamp the uterosacral ligaments on each side. These were cut and suture ligated using 0-Vicryl in a Heaney fashion, and tagged with hemostats. Anteriorly, the bladder was dissected off the pubocervical fascia using Metzenbaum scissors. A Deaver retractor was placed anteriorly to protect the bladder. The anterior peritoneal reflection was then identified, tented up and incised with Metzenbaum scissors to create an anterior colpotomy. Palpation confirmed peritoneal entry and no adhesions. The Deaver was placed anteriorly to protect the bladder. The cardinal ligaments were clamped, ligated nad cut with the ligasure in a similar fashion on each side. The uterine arteries were also clamped and ligated with the ligasure. The cornua were clamped, cut, free-tied and suture ligated. The uterus and cervix were handed off the field.  Inspection of the pedicles revealed excellent hemostasis.  The right fallopian tube was grapsed with a Babcock clamp. The mesosalpinx was ligated and cut with the ligasure to remove the tube. This was repeated on the left side. For the uterosacral ligament suspension (USLS), the bowel was packed away with a moistened lap pad. The posterior cuff edge was grasped with an Allis clamp.  The right and left uterosacral ligaments were identified visually and digitally. Two stitches of 0 PDS was placed through each uterosacral ligament towards its insertion site at the sacrum. These were tagged.. The packing was removed.  A 70-degree cystoscope was introduced, and 360-degree inspection revealed no trauma in the bladder, with bilateral ureteral efflux  with tension on the  uterosacral sutures.  The bladder was drained and the cystoscope was removed.  The Foley catheter was reinserted.    For the anterior repair, two Allis clamps were placed along the midline of the anterior vaginal wall.  1% lidocaine with epinephrine was injected into the vaginal mucosa.  A 15 blade scalpel was used to incise the vaginal mucosa in the midline. Allis clamps were placed along this incision and Metzenbaum scissors were used to sharply dissect the epithelium off of the vesicovaginal septum bilaterally to the level of the pubic rami. Anterior plication of the vesicovaginal septum was then performed using 2-0 Vicryl. The vaginal mucosal edges were trimmed and the incision reapproximated with 2-0 Vicryl in a running locked fashion. Hemostasis was noted. The uterosacral stitches were then attached to the posterior and anterior edges of the vaginal cuff on the ipsilateral sides, in a through and through fashion, using a free needle. Figure of eight sutures of 0-Vicryl were placed through the vaginal cuff and tied down. The lateral uterosacral stitches were then tied down with good apical support noted. The Foley catheter was removed. A 70-degree cystoscope was introduced, and 360-degree inspection revealed no trauma in the bladder, with bilateral ureteral efflux. The cystoscope was removed. The medial uterosacral sutures were then tied down. Cystoscopy was repeated and brisk bilateral ureteral efflux was noted. The bladder was drained and the cystoscope was removed.  The Foley catheter was reinserted.  Attention was then turned to the posterior vagina.  Two Allis clamps were in the midline of the posterior vaginal wall defect.  1% lidocaine with epinephrine was injected into the vaginal mucosa. A vertical incision was made between these clamps with a 15 blade scalpel and a diamond shaped area of epithelium was cut over the perineum. Excess tissue was excised.  The rectovaginal septum was then dissected  off the vaginal mucosa bilaterally. The rectovaginal septum was then plicated with vertical mattress sutures of 2-0 Vicryl.  After placement of the first plication stitch two fingers were inserted into the vaginal to confirm adequate caliber.  The last distal stitch incorporated the perineal body in a U stitch fashion.   After plication, the excess vaginal mucosa was trimmed and the vaginal mucosa was reapproximated using 2-0 Vicryl sutures.  The perineal body was then reapproximated with two interrupted 0-vicryl sutures. The perineal skin was then closed with a 2-0 vicryl in a subcutaneous and subcuticular fashion. . The vagina was copiously irrigated.  Two figure of eight sutures were placed along the anterior vaginal wall with 2-0 vicryl. Hemostasis was noted. A rectal examination was normal and confirmed no sutures within the rectum.   A 0 degree Bulkamid cystoscope was inserted into the urethra into the bladder.  The needle was primed.  The cystoscope was inserted to the level of the bladder neck.  The needle was inserted 2 cm and the scope was pulled back into the urethra 2 cm.  The needle was inserted bevel up at the 5 o'clock position and the Bulkamid was injected to obtain coaptation.  This was repeated at the 2 o'clock,  10 o'clock and 7 o'clock positions.   A total of 2- 1ml syringes were used. and good circumferential coaptation was noted.    The patient tolerated the procedure well.  She was awakened from anesthesia and transferred to the recovery room in stable condition. All counts were correct x 2.     Marguerita Beards, MD

## 2023-09-24 NOTE — Progress Notes (Signed)
Patient's pharmacy in [place] called and reported that the Narcotic pain medication was not available. New script sent to [facility] long outpatient pharmacy for patient to get as patient is in surgery at this time.

## 2023-09-24 NOTE — Transfer of Care (Signed)
Immediate Anesthesia Transfer of Care Note  Patient: Briana Valdez  Procedure(s) Performed: HYSTERECTOMY VAGINAL WITH BILATERAL SALPINGECTOMY (Vagina ) UTEROSACRAL LIGAMENT SUSPENSION (Vagina ) ANTERIOR (CYSTOCELE) AND POSTERIOR REPAIR (RECTOCELE) with perineorrhaphy (Vagina ) URETHRAL BULKING (Urethra) CYSTOSCOPY (Bladder)  Patient Location: PACU  Anesthesia Type:General  Level of Consciousness: awake, alert , oriented, and patient cooperative  Airway & Oxygen Therapy: Patient Spontanous Breathing  Post-op Assessment: Report given to RN and Post -op Vital signs reviewed and stable  Post vital signs: Reviewed and stable  Last Vitals:  Vitals Value Taken Time  BP 123/72 09/24/23 1045  Temp    Pulse 95 09/24/23 1048  Resp 17 09/24/23 1048  SpO2 100 % 09/24/23 1048  Vitals shown include unfiled device data.  Last Pain:  Vitals:   09/24/23 0612  TempSrc: Oral  PainSc: 0-No pain      Patients Stated Pain Goal: 5 (09/24/23 0612)  Complications: No notable events documented.

## 2023-09-24 NOTE — Anesthesia Postprocedure Evaluation (Signed)
Anesthesia Post Note  Patient: Briana Valdez  Procedure(s) Performed: HYSTERECTOMY VAGINAL WITH BILATERAL SALPINGECTOMY (Vagina ) UTEROSACRAL LIGAMENT SUSPENSION (Vagina ) ANTERIOR (CYSTOCELE) AND POSTERIOR REPAIR (RECTOCELE) with perineorrhaphy (Vagina ) URETHRAL BULKING (Urethra) CYSTOSCOPY (Bladder)     Patient location during evaluation: PACU Anesthesia Type: General Level of consciousness: awake and alert Pain management: pain level controlled Vital Signs Assessment: post-procedure vital signs reviewed and stable Respiratory status: spontaneous breathing, nonlabored ventilation and respiratory function stable Cardiovascular status: blood pressure returned to baseline and stable Postop Assessment: no apparent nausea or vomiting Anesthetic complications: no  No notable events documented.  Last Vitals:  Vitals:   09/24/23 1139 09/24/23 1155  BP: 111/70 112/72  Pulse: 71 60  Resp: 16 16  Temp: 36.9 C 36.7 C  SpO2: 99% 97%    Last Pain:  Vitals:   09/24/23 1139  TempSrc:   PainSc: 5                  [name],W. EDMOND

## 2023-09-24 NOTE — Interval H&P Note (Signed)
History and Physical Interval Note:  09/24/2023 7:18 AM  Briana Valdez  has presented today for surgery, with the diagnosis of uterovaginal prolpase incomplete; stress urinary incontinence.  The various methods of treatment have been discussed with the patient and family. After consideration of risks, benefits and other options for treatment, the patient has consented to  Procedure(s): HYSTERECTOMY VAGINAL WITH BILATERAL SALPINGECTOMY (N/A) UTEROSACRAL LIGAMENT SUSPENSION (N/A) ANTERIOR (CYSTOCELE) AND possible POSTERIOR REPAIR (RECTOCELE) with perineorrhaphy (N/A) URETHRAL BULKING (N/A) CYSTOSCOPY (N/A) as a surgical intervention.  The patient's history has been reviewed, patient examined, no change in status, stable for surgery.  I have reviewed the patient's chart and labs.  Questions were answered to the patient's satisfaction.     Marguerita Beards

## 2023-09-24 NOTE — Discharge Instructions (Signed)

## 2023-09-24 NOTE — Telephone Encounter (Signed)
Briana Valdez underwent Total vaginal hysterectomy with bilateral salpingectomy, uterosacral ligament suspension, anterior and posterior repair with perineorrhaphy, cystoscopy, urethral bulking (Bulkamid) on 09/24/23.   She passed her voiding trial.  was backfilled into the bladder Voided  PVR by bladder scan was .   She was discharged without a catheter. Please call her for a routine post op check. Thanks!  Marguerita Beards, MD

## 2023-09-24 NOTE — Anesthesia Procedure Notes (Signed)
Procedure Name: Intubation Date/Time: 09/24/2023 7:48 AM  Performed by: Bishop Limbo, CRNAPre-anesthesia Checklist: Patient identified, Emergency Drugs available, Suction available and Patient being monitored Patient Re-evaluated:Patient Re-evaluated prior to induction Oxygen Delivery Method: Circle System Utilized Preoxygenation: Pre-oxygenation with 100% oxygen Induction Type: IV induction Ventilation: Mask ventilation without difficulty Laryngoscope Size: Mac and 3 Grade View: Grade I Tube type: Oral Tube size: 7.0 mm Number of attempts: 1 Airway Equipment and Method: Stylet Placement Confirmation: ETT inserted through vocal cords under direct vision, positive ETCO2 and breath sounds checked- equal and bilateral Secured at: 22 cm Tube secured with: Tape Dental Injury: Teeth and Oropharynx as per pre-operative assessment

## 2023-09-25 LAB — SURGICAL PATHOLOGY

## 2023-09-26 ENCOUNTER — Encounter (HOSPITAL_BASED_OUTPATIENT_CLINIC_OR_DEPARTMENT_OTHER): Payer: Self-pay | Admitting: Obstetrics and Gynecology

## 2023-09-26 NOTE — Telephone Encounter (Signed)
 Called and spoke to patient: Patient reports yesterday she was sore and has been walking laps inside the house. Patient reports she has a bowel movement yesterday.  Patient states she is taking Tylenol  and Ibuprofen  but has not taken narcotics. She has been using a heating pad for her bottom.  Light bleeding and no clotting noted.  Patient reports she has had some overactive bladder symptoms since Monday night but that has calmed down.

## 2023-10-04 ENCOUNTER — Encounter: Payer: Self-pay | Admitting: Obstetrics and Gynecology

## 2023-10-09 ENCOUNTER — Telehealth: Payer: Self-pay | Admitting: Obstetrics

## 2023-10-09 NOTE — Telephone Encounter (Signed)
43yo POD# 15 s/p TVH, bilateral salpingectomy, uterosacral ligament suspension, anterior and posterior repair, urethral bulking and cystoscopy.  Reports minimal spotting since surgery using 1 light pad/day. Experienced increased bleeding after going to the grocery store today and noted bright red bleeding. Changed 2-3 pads with 2-3cm line on pad, denies blood clots or bleeding down her leg. Denies dysuria, however reports vaginal discomfort at the end of void. Denies urinary frequency, urgency, or leakage.  Reports sinus drainage, denies N/V and tolerating PO. Reports R flank aching 3-4/10, reports history of R kidney stone. Denies taking OTC or prescribed pain medication for 1 week.   Encouraged to monitor symptoms and reassured pt.  Encouraged NSAID or tylenol use for discomfort.  Continue to monitor bleeding and encouraged to callback if she experiences increase bleeding soaking 1 pad/hour, pain, large clots or UTI symptoms.  Patient expresses understanding, all questions answered.

## 2023-10-10 ENCOUNTER — Ambulatory Visit: Payer: BC Managed Care – PPO | Admitting: Urology

## 2023-10-15 ENCOUNTER — Encounter: Payer: Self-pay | Admitting: Family Medicine

## 2023-10-15 ENCOUNTER — Ambulatory Visit: Payer: BC Managed Care – PPO | Admitting: Family Medicine

## 2023-10-15 VITALS — BP 116/80 | HR 62 | Temp 97.3°F | Ht 61.0 in | Wt 122.0 lb

## 2023-10-15 DIAGNOSIS — Z8379 Family history of other diseases of the digestive system: Secondary | ICD-10-CM

## 2023-10-15 DIAGNOSIS — I700 Atherosclerosis of aorta: Secondary | ICD-10-CM

## 2023-10-15 NOTE — Progress Notes (Signed)
   Subjective:    Patient ID: Briana Valdez, female    DOB: Feb 07, 1981, 43 y.o.   MRN: [id number]  Discussed the use of AI scribe software for clinical note transcription with the patient, who gave verbal consent to proceed.  History of Present Illness   Briana Valdez is a 43 year old female who presents for follow-up after a hysterectomy.  Her ovaries were preserved.  She has a history of kidney stones, with a recent scan showing a 3 mm obstructing renal calculus that has since passed. However, she has other small stones within the kidney. She drinks primarily water and one cup of coffee a day.  She has been informed of aortic atherosclerosis seen on her scan, which has prompted a discussion about her cholesterol levels. Her LDL cholesterol has increased from 72 to 93 over the past few years.  She has a family history of liver issues, including cirrhosis in her father and liver failure in her aunt. Her recent scan showed small foci of parenchymal low attenuation in the liver, which are suspected to be benign hemangiomas.         Review of Systems     Objective:    Physical Exam       Lungs clear heart regular       Assessment & Plan:  Assessment and Plan    Post-Hysterectomy Recovery Successful surgery with no complications. Currently in week three of recovery. Limited to lifting no more than 10 pounds until week four. Post-op appointment scheduled for March 18th. -Continue light exercises and breathing exercises as directed by gynecologist. -Gradually increase physical activity as tolerated, aiming to walk a mile by six-week mark.  Liver Lesions Small foci of parenchymal low attenuation seen in liver, likely benign hemangiomas. Family history of liver disease noted. -Consult with Dr. Marletta Lor for further evaluation and possible MRI of liver.  Renal Calculus History of 3mm obstructing renal calculus, which has since passed. -Continue high fluid intake, including  occasional citrus drinks to acidify urine and prevent stone formation.  Aortic Atherosclerosis LDL cholesterol slightly elevated at 93, with a goal of less than 70 due to presence of aortic atherosclerosis. Discussed dietary modifications and potential need for statin therapy. -Encourage healthy diet, focusing on fruits, vegetables, and lean meats, with limited processed foods and sugars. -Repeat lipid panel in June 2025 to reassess LDL level. -Consider low-dose statin therapy if LDL remains above 70 despite dietary modifications.  General Health Maintenance -Scheduled for annual physical in April 2025. -Consider genetic testing for liver disease, given family history.     Significant time spent with patient discussing with her her findings.  She will work very hard on a healthy diet repeat lab work and if it is not at goal recommend low-dose statin to get LDL below 70  Patient also relates a strong family history of cirrhosis and premature death from liver issues we will consult Dr. Marletta Lor regarding her findings from the CAT scan of the liver as well as family history of cirrhosis patient may well benefit from genetic testing  Patient would also benefit from GI commenting on her liver CAT scan results not sure if she may benefit from having an MRI to look at these areas closer?

## 2023-10-16 ENCOUNTER — Telehealth: Payer: Self-pay | Admitting: Internal Medicine

## 2023-10-16 NOTE — Telephone Encounter (Signed)
 Please arrange OV for this patient with me to discuss abnormal CT of the liver.  Thank you

## 2023-10-17 ENCOUNTER — Other Ambulatory Visit: Payer: Self-pay

## 2023-10-17 DIAGNOSIS — Z8379 Family history of other diseases of the digestive system: Secondary | ICD-10-CM

## 2023-10-17 NOTE — Progress Notes (Unsigned)
 re

## 2023-10-19 ENCOUNTER — Ambulatory Visit
Admission: RE | Admit: 2023-10-19 | Discharge: 2023-10-19 | Disposition: A | Discharge status: Home / Self Care | Payer: BC Managed Care – PPO | Source: Ambulatory Visit

## 2023-10-19 DIAGNOSIS — Z1231 Encounter for screening mammogram for malignant neoplasm of breast: Secondary | ICD-10-CM

## 2023-10-21 ENCOUNTER — Encounter: Payer: Self-pay | Admitting: Obstetrics and Gynecology

## 2023-10-22 ENCOUNTER — Ambulatory Visit (INDEPENDENT_AMBULATORY_CARE_PROVIDER_SITE_OTHER)

## 2023-10-22 ENCOUNTER — Other Ambulatory Visit (HOSPITAL_COMMUNITY)
Admission: RE | Admit: 2023-10-22 | Discharge: 2023-10-22 | Disposition: A | Discharge status: Home / Self Care | Source: Ambulatory Visit | Attending: Obstetrics and Gynecology | Admitting: Obstetrics and Gynecology

## 2023-10-22 DIAGNOSIS — N898 Other specified noninflammatory disorders of vagina: Secondary | ICD-10-CM | POA: Diagnosis not present

## 2023-10-22 NOTE — Progress Notes (Signed)
 Briana Valdez is a 43 y.o. female came to the office today for a self swabbing Aptima test. Patient had no complications during the process. Sample has been sent to the lab for processing.

## 2023-10-22 NOTE — Patient Instructions (Signed)
 Please keep any scheduled follow ups.  It was a pleasure to see you today!  Thank you for trusting me with your care!

## 2023-10-22 NOTE — Telephone Encounter (Signed)
 Patient is coming in the office for vaginal swab

## 2023-10-23 LAB — CERVICOVAGINAL ANCILLARY ONLY
Bacterial Vaginitis (gardnerella): POSITIVE — AB
Candida Glabrata: NEGATIVE
Candida Vaginitis: NEGATIVE
Comment: NEGATIVE
Comment: NEGATIVE
Comment: NEGATIVE

## 2023-10-23 MED ORDER — METRONIDAZOLE 500 MG PO TABS
500.0000 mg | ORAL_TABLET | Freq: Two times a day (BID) | ORAL | 0 refills | Status: AC
Start: 2023-10-23 — End: 2023-10-30

## 2023-10-23 NOTE — Addendum Note (Signed)
 Addended by: Marguerita Beards on: 10/23/2023 03:09 PM   Modules accepted: Orders

## 2023-10-25 ENCOUNTER — Ambulatory Visit (INDEPENDENT_AMBULATORY_CARE_PROVIDER_SITE_OTHER): Payer: BC Managed Care – PPO | Admitting: Internal Medicine

## 2023-10-25 ENCOUNTER — Encounter: Payer: Self-pay | Admitting: Obstetrics and Gynecology

## 2023-10-25 ENCOUNTER — Telehealth: Payer: Self-pay | Admitting: *Deleted

## 2023-10-25 ENCOUNTER — Encounter: Payer: Self-pay | Admitting: Internal Medicine

## 2023-10-25 VITALS — BP 133/84 | HR 70 | Temp 98.6°F | Ht 61.0 in | Wt 121.2 lb

## 2023-10-25 DIAGNOSIS — Z8379 Family history of other diseases of the digestive system: Secondary | ICD-10-CM | POA: Diagnosis not present

## 2023-10-25 DIAGNOSIS — K5900 Constipation, unspecified: Secondary | ICD-10-CM | POA: Diagnosis not present

## 2023-10-25 DIAGNOSIS — K5904 Chronic idiopathic constipation: Secondary | ICD-10-CM

## 2023-10-25 DIAGNOSIS — R932 Abnormal findings on diagnostic imaging of liver and biliary tract: Secondary | ICD-10-CM

## 2023-10-25 NOTE — Telephone Encounter (Signed)
 Order ID: [id number]       Completed  Approval Valid Through: 10/25/2023 - 11/23/2023

## 2023-10-25 NOTE — Patient Instructions (Addendum)
 I am going to order an MRI of your liver to further evaluate the spot seen on CT scan.  I am also going to check blood work today at [facility] to check your liver numbers.  We will call with these results.  If everything looks good, I think you can follow-up as needed.  It was very nice seeing you again today.  Dr. Marletta Lor

## 2023-10-25 NOTE — Progress Notes (Signed)
 Referring Provider: Babs Sciara, MD Primary Care Physician:  Babs Sciara, MD Primary GI:  Dr. Marletta Lor  Chief Complaint  Patient presents with   Abnormal CT    HPI:   Briana Valdez is a 43 y.o. female who presents to clinic today for follow-up visit.  Patient presented to [facility], ER due to abdominal pain.  She had a noncontrasted CT of the abdomen pelvis which I personally reviewed, showed kidney stones.  In her liver, she had a small foci within the right and left lobes of the liver.  Gallbladder normal.  She has significant history of cirrhosis in her father and aunt.  Her father recently passed away approximately 2 months ago from complications from his end-stage liver disease.  Patient does not have any history of fatty liver, diabetes, hypertension, dyslipidemia.  Denies any abdominal pain.  Does have mild constipation which is well-controlled on Colace daily.      Past Medical History:  Diagnosis Date   Hemorrhoids    Pituitary microadenoma with hyperprolactinemia [facility]) 2018   endocrinologist--- dr Lonzo Cloud; dx 2018;     last MRI 02/ 2024 in epic stable adenoma;  prolactin level normal while taking cabergoline   PONV (postoperative nausea and vomiting)    Renal calculus, left 09/01/2023   urologist-- dr Annabell Howells;  last passed one ureter stone   SUI (stress urinary incontinence, female)    Uterovaginal prolapse, incomplete     Past Surgical History:  Procedure Laterality Date   ANTERIOR AND POSTERIOR REPAIR N/A 09/24/2023   Procedure: ANTERIOR (CYSTOCELE) AND POSTERIOR REPAIR (RECTOCELE) with perineorrhaphy;  Surgeon: Marguerita Beards, MD;  Location: [facility];  Service: Gynecology;  Laterality: N/A;   BREAST ENHANCEMENT SURGERY Bilateral 2010   w/ implants   COLONOSCOPY WITH PROPOFOL N/A 10/18/2020   by dr Marletta Lor ;   Non-bleeding internal hemorrhoids, otherwise normal.   CYSTOSCOPY N/A 09/24/2023   Procedure: CYSTOSCOPY;   Surgeon: Marguerita Beards, MD;  Location: [facility];  Service: Gynecology;  Laterality: N/A;   VAGINAL HYSTERECTOMY N/A 09/24/2023   Procedure: HYSTERECTOMY VAGINAL WITH BILATERAL SALPINGECTOMY;  Surgeon: Marguerita Beards, MD;  Location: [facility];  Service: Gynecology;  Laterality: N/A;   VAGINAL PROLAPSE REPAIR N/A 09/24/2023   Procedure: UTEROSACRAL LIGAMENT SUSPENSION;  Surgeon: Marguerita Beards, MD;  Location: [facility];  Service: Gynecology;  Laterality: N/A;    Current Outpatient Medications  Medication Sig Dispense Refill   cabergoline (DOSTINEX) 0.5 MG tablet Take 0.5 tablets (0.25 mg total) by mouth once a week. (Patient taking differently: Take 0.25 mg by mouth once a week. Friday's) 10 tablet 3   docusate sodium (COLACE) 50 MG capsule Take 50 mg by mouth daily.     metroNIDAZOLE (FLAGYL) 500 MG tablet Take 1 tablet (500 mg total) by mouth 2 (two) times daily for 7 days. 14 tablet 0   phenylephrine (SUDAFED PE) 10 MG TABS tablet Take 10 mg by mouth every 4 (four) hours as needed.     No current facility-administered medications for this visit.    Allergies as of 10/25/2023   (No Known Allergies)    Family History  Problem Relation Age of Onset   Breast cancer Mother    Cancer Mother        breast   Diabetes Father    Hypertension Father    Liver disease Father    Breast cancer Maternal Grandmother  Breast cancer Paternal Grandmother    Colon cancer Paternal Grandmother    Colon polyps Neg Hx    Other Neg Hx     Social History   Socioeconomic History   Marital status: Married    Spouse name: Therapist, nutritional   Number of children: 3   Years of education: Not on file   Highest education level: Associate degree: occupational, Scientist, product/process development, or vocational program  Occupational History    Comment: [name]   Occupation: Producer, television/film/video  Tobacco Use   Smoking status: Never   Smokeless tobacco: Never  Vaping Use   Vaping  status: Never Used  Substance and Sexual Activity   Alcohol use: No   Drug use: Never   Sexual activity: Yes    Birth control/protection: I.U.D.  Other Topics Concern   Not on file  Social History Narrative   Lives with family   Social Drivers of Health   Financial Resource Strain: Low Risk  (08/06/2023)   Overall Financial Resource Strain (CARDIA)    Difficulty of Paying Living Expenses: Not hard at all  Food Insecurity: No Food Insecurity (08/06/2023)   Hunger Vital Sign    Worried About Running Out of Food in the Last Year: Never true    Ran Out of Food in the Last Year: Never true  Transportation Needs: No Transportation Needs (08/06/2023)   PRAPARE - Administrator, Civil Service (Medical): No    Lack of Transportation (Non-Medical): No  Physical Activity: Unknown (08/06/2023)   Exercise Vital Sign    Days of Exercise per Week: 0 days    Minutes of Exercise per Session: Not on file  Stress: Stress Concern Present (08/06/2023)   [employer] of Occupational Health - Occupational Stress Questionnaire    Feeling of Stress : Rather much  Social Connections: Socially Integrated (08/06/2023)   Social Connection and Isolation Panel [NHANES]    Frequency of Communication with Friends and Family: More than three times a week    Frequency of Social Gatherings with Friends and Family: Once a week    Attends Religious Services: More than 4 times per year    Active Member of [employer] or Organizations: Yes    Attends Banker Meetings: 1 to 4 times per year    Marital Status: Married    Subjective: Review of Systems  Constitutional:  Negative for chills and fever.  HENT:  Negative for congestion and hearing loss.   Eyes:  Negative for blurred vision and double vision.  Respiratory:  Negative for cough and shortness of breath.   Cardiovascular:  Negative for chest pain and palpitations.  Gastrointestinal:  Negative for abdominal pain, blood in stool,  constipation, diarrhea, heartburn, melena and vomiting.  Genitourinary:  Negative for dysuria and urgency.  Musculoskeletal:  Negative for joint pain and myalgias.  Skin:  Negative for itching and rash.  Neurological:  Negative for dizziness and headaches.  Psychiatric/Behavioral:  Negative for depression. The patient is not nervous/anxious.      Objective: BP 133/84 (BP Location: Right Arm, Patient Position: Sitting, Cuff Size: Normal)   Pulse 70   Temp 98.6 F (37 C) (Oral)   Ht 5\' 1"  (1.549 m)   Wt 121 lb 3.2 oz (55 kg)   SpO2 98%   BMI 22.90 kg/m  Physical Exam Constitutional:      Appearance: Normal appearance.  HENT:     Head: Normocephalic and atraumatic.  Eyes:     Extraocular Movements: Extraocular movements intact.  Conjunctiva/sclera: Conjunctivae normal.  Cardiovascular:     Rate and Rhythm: Normal rate and regular rhythm.  Pulmonary:     Effort: Pulmonary effort is normal.     Breath sounds: Normal breath sounds.  Abdominal:     General: Bowel sounds are normal.     Palpations: Abdomen is soft.  Musculoskeletal:        General: No swelling. Normal range of motion.     Cervical back: Normal range of motion and neck supple.  Skin:    General: Skin is warm and dry.     Coloration: Skin is not jaundiced.  Neurological:     General: No focal deficit present.     Mental Status: She is alert and oriented to person, place, and time.  Psychiatric:        Mood and Affect: Mood normal.        Behavior: Behavior normal.      Assessment/Plan:  1.  Abnormal CT scan of the liver, family history of cirrhosis in multiple relatives-discussed CT findings with patient today.  Chance of this being of any sort of consequence is very low.  She has no risk factors for liver disease.  I will check hepatic function panel today.  Given her family history, we will proceed with MRI of her liver to further evaluate.  If everything appears normal, I think she can follow-up  as needed.  2.  Constipation-mild, continue Colace daily.   10/25/2023 9:28 AM   Disclaimer: This note was dictated with voice recognition software. Similar sounding words can inadvertently be transcribed and may not be corrected upon review.

## 2023-10-26 DIAGNOSIS — R932 Abnormal findings on diagnostic imaging of liver and biliary tract: Secondary | ICD-10-CM | POA: Diagnosis not present

## 2023-10-26 NOTE — Telephone Encounter (Signed)
 I spoke with patient this morning, she stated she had a gush of blood after her floor exercise when she stood up. Patient placed a pad on right after but speaking to her this morning she says she doesn't have on a pad and light pink blood is only seen when she wipes but not enough to be seen on a pad. I let the patient know as long as the bleeding is subsiding she should be okay but in the event it starts heavy again to please call the office.

## 2023-10-27 ENCOUNTER — Ambulatory Visit (HOSPITAL_COMMUNITY)
Admission: RE | Admit: 2023-10-27 | Discharge: 2023-10-27 | Disposition: A | Discharge status: Home / Self Care | Source: Ambulatory Visit | Attending: Internal Medicine | Admitting: Internal Medicine

## 2023-10-27 DIAGNOSIS — R945 Abnormal results of liver function studies: Secondary | ICD-10-CM | POA: Diagnosis not present

## 2023-10-27 DIAGNOSIS — Z8379 Family history of other diseases of the digestive system: Secondary | ICD-10-CM | POA: Diagnosis not present

## 2023-10-27 DIAGNOSIS — R932 Abnormal findings on diagnostic imaging of liver and biliary tract: Secondary | ICD-10-CM | POA: Diagnosis not present

## 2023-10-27 LAB — HEPATIC FUNCTION PANEL
ALT: 12 IU/L (ref 0–32)
AST: 15 IU/L (ref 0–40)
Albumin: 4.6 g/dL (ref 3.9–4.9)
Alkaline Phosphatase: 43 IU/L — ABNORMAL LOW (ref 44–121)
Bilirubin Total: 0.4 mg/dL (ref 0.0–1.2)
Bilirubin, Direct: 0.14 mg/dL (ref 0.00–0.40)
Total Protein: 7.2 g/dL (ref 6.0–8.5)

## 2023-10-27 MED ORDER — GADOBUTROL 1 MMOL/ML IV SOLN
5.5000 mL | Freq: Once | INTRAVENOUS | Status: AC | PRN
  Administered 2023-10-27: 5.5 mL via INTRAVENOUS

## 2023-11-05 ENCOUNTER — Telehealth: Admitting: Physician Assistant

## 2023-11-05 DIAGNOSIS — B001 Herpesviral vesicular dermatitis: Secondary | ICD-10-CM

## 2023-11-05 MED ORDER — VALACYCLOVIR HCL 1 G PO TABS: 2000.0000 mg | ORAL_TABLET | Freq: Two times a day (BID) | ORAL | 0 refills | Status: AC

## 2023-11-05 NOTE — Progress Notes (Signed)

## 2023-11-05 NOTE — Progress Notes (Signed)
 I have spent 5 minutes in review of e-visit questionnaire, review and updating patient chart, medical decision making and response to patient.   Piedad Climes, PA-C

## 2023-11-06 ENCOUNTER — Ambulatory Visit (INDEPENDENT_AMBULATORY_CARE_PROVIDER_SITE_OTHER): Payer: BC Managed Care – PPO | Admitting: Obstetrics and Gynecology

## 2023-11-06 ENCOUNTER — Encounter: Payer: Self-pay | Admitting: Obstetrics and Gynecology

## 2023-11-06 VITALS — BP 147/100 | HR 74

## 2023-11-06 DIAGNOSIS — Z9889 Other specified postprocedural states: Secondary | ICD-10-CM

## 2023-11-06 DIAGNOSIS — Z48815 Encounter for surgical aftercare following surgery on the digestive system: Secondary | ICD-10-CM

## 2023-11-06 MED ORDER — ESTRADIOL 0.1 MG/GM VA CREA
TOPICAL_CREAM | VAGINAL | 1 refills | Status: DC
Start: 2023-11-08 — End: 2024-05-01

## 2023-11-06 NOTE — Progress Notes (Signed)
 [facility]  Date of Visit: 11/06/2023  History of Present Illness: Briana Valdez is a 43 y.o. female scheduled today for a post-operative visit.   Surgery: s/p Total vaginal hysterectomy with bilateral salpingectomy, uterosacral ligament suspension, anterior and posterior repair with perineorrhaphy, cystoscopy, urethral bulking ([place]) on 09/24/23  She passed her postoperative void trial.   Postoperative course has been uncomplicated. Was treated for BV.   Today she reports had some intermittent bleeding but has now resolved. No vaginal discharge symptoms. Had some soreness at the vaginal opening but overall that has improved as well.   UTI in the last 6 weeks? No  Pain? No  She has returned to her normal activity (except for postop restrictions) Vaginal bulge? No  Stress incontinence: Yes - a few weeks ago sneezed once and had some leakage Urgency/frequency: No  Urge incontinence: No  Voiding dysfunction: No  Bowel issues: No   Subjective Success: Do you usually have a bulge or something falling out that you can see or feel in the vaginal area? No  Retreatment Success: Any retreatment with surgery or pessary for any compartment? No   Pathology results: UTERUS, CERVIX, FALLOPIAN TUBE, BILATERAL, HYSTERECTOMY:  Cervix:           Unremarkable.            Negative for dysplasia or malignancy.        Endocervix:            Nabothian cysts.            Negative for hyperplasia, atypia or malignancy.        Endometrium:            Benign proliferative endometrium.            Negative for hyperplasia, atypia or malignancy.        Myometrium:            Leiomyomata, multiple, submucosal, intramural and subserosal.            Negative for malignancy.        Serosa:            Unremarkable.            Negative for malignancy.        Bilateral fallopian tubes:            Benign fimbriated fallopian tubes with paratubal cyst.            Negative for malignancy.    Medications: She has a current medication list which includes the following prescription(s): cabergoline, docusate sodium, [START ON 11/08/2023] estradiol, phenylephrine, and valacyclovir.   Allergies: Patient has no known allergies.   Physical Exam: BP (!) 147/100   Pulse 74    Pelvic Examination: Perineal incision with slight separation, sutures still present. Vagina: Incisions healing well. Sutures are present at the cuff and there is not granulation tissue. No tenderness along the anterior or posterior vagina. No apical tenderness. No pelvic masses.   POP-Q: POP-Q  -2.5                                            Aa   -2.5  Ba  -7                                              C   4                                            Gh  4                                            Pb  7.5                                            tvl   -3                                            Ap  -3                                            Bp                                                 D    ---------------------------------------------------------  Assessment and Plan:  1. Post-operative state    - Overall healing well but slight separation of perineal incision. Will give vaginal estrace cream to use in that area and will recheck in a month.  - Pathology results were reviewed with the patient today and she verbalized understanding that the results were benign.  - Can resume regular activity including exercise. Discussed avoidance of heavy lifting and straining long term to reduce the risk of recurrence. Wait an additional 6 weeks prior to intercourse.   All questions answered.   Briana Beards, MD

## 2023-11-06 NOTE — Patient Instructions (Signed)
 Start vaginal estrogen therapy nightly for two weeks then 2 times weekly at night for treatment to help with healing. Place a pea size amount on your finger and at the vaginal opening.

## 2023-11-08 ENCOUNTER — Telehealth

## 2023-11-08 ENCOUNTER — Encounter

## 2023-11-08 DIAGNOSIS — B370 Candidal stomatitis: Secondary | ICD-10-CM

## 2023-11-09 MED ORDER — NYSTATIN 100000 UNIT/ML MT SUSP: 5.0000 mL | Freq: Four times a day (QID) | OROMUCOSAL | 0 refills | Status: DC

## 2023-11-09 NOTE — Progress Notes (Signed)
 E-Visit for Sore Mouth  We are sorry that you are not feeling well.  Here is how we plan to help!  Based on what you have shared with me, it appears that you do have thrush. This is a yeast infection of the mouth, most likely secondary to the recent antibiotic use.     The following medications should decrease the discomfort and help with healing.  Nystatin mouthwash Swish and spit out 5mL Nystatin mouth rinse 4 times daily for 5-7 days    Home Care:   Avoid spicy and acidic foods Eat soft foods and avoid rough, crunchy foods Avoid chewing gum Do not use toothpaste that contains sodium lauryl sulphite Use a straw to drink which helps avoid liquids toughing the ulcers near the front of your mouth Use a very soft toothbrush If you have dentures or dental hardware that you feel is not fitting well or contributing to his, please see your dentist. Use saltwater mouthwash which helps healing. Dissolve a  teaspoon of salt in a glass of warm water. Swish around your mouth and spit it out. This can be used as needed if it is soothing.   GET HELP RIGHT AWAY IF: Persistent ulcers require checking IN PERSON (face to face). Any mouth lesion lasting longer than a month should be seen by your DENTIST as soon as possible for evaluation for possible oral cancer. If you have a non-painful ulcer in 1 or more areas of your mouth Ulcers that are spreading, are very large or particularly painful Ulcers last longer than one week without improving on treatment If you develop a fever, swollen glands and begin to feel unwell Ulcers that developed after starting a new medication MAKE SURE YOU: Understand these instructions. Will watch your condition. Will get help right away if you are not doing well or get worse.  Thank you for choosing an e-visit.  Your e-visit answers were reviewed by a board certified advanced clinical practitioner to complete your personal care plan. Depending upon the condition, your  plan could have included both over the counter or prescription medications.  Please review your pharmacy choice. Make sure the pharmacy is open so you can pick up prescription now. If there is a problem, you may contact your provider through [employer] and have the prescription routed to another pharmacy.  Your safety is important to [place]. If you have drug allergies check your prescription carefully.   For the next 24 hours you can use MyChart to ask questions about today's visit, request a non-urgent call back, or ask for a work or school excuse. You will get an email in the next two days asking about your experience. I hope that your e-visit has been valuable and will speed your recovery.   I have spent 5 minutes in review of e-visit questionnaire, review and updating patient chart, medical decision making and response to patient.   Margaretann Loveless, PA-C

## 2023-11-13 ENCOUNTER — Encounter: Payer: Self-pay | Admitting: Obstetrics and Gynecology

## 2023-11-22 ENCOUNTER — Encounter: Payer: Self-pay | Admitting: Family Medicine

## 2023-11-22 ENCOUNTER — Other Ambulatory Visit (HOSPITAL_COMMUNITY)
Admission: RE | Admit: 2023-11-22 | Discharge: 2023-11-22 | Disposition: A | Discharge status: Home / Self Care | Source: Ambulatory Visit | Attending: Family Medicine | Admitting: Family Medicine

## 2023-11-22 ENCOUNTER — Other Ambulatory Visit: Payer: Self-pay

## 2023-11-22 ENCOUNTER — Ambulatory Visit: Admitting: Family Medicine

## 2023-11-22 ENCOUNTER — Ambulatory Visit (HOSPITAL_COMMUNITY)
Admission: RE | Admit: 2023-11-22 | Discharge: 2023-11-22 | Disposition: A | Discharge status: Home / Self Care | Source: Ambulatory Visit | Attending: Family Medicine | Admitting: Family Medicine

## 2023-11-22 ENCOUNTER — Emergency Department (HOSPITAL_COMMUNITY)
Admission: EM | Admit: 2023-11-22 | Discharge: 2023-11-22 | Disposition: A | Discharge status: Home / Self Care | Attending: Emergency Medicine | Admitting: Emergency Medicine

## 2023-11-22 ENCOUNTER — Emergency Department (HOSPITAL_COMMUNITY)

## 2023-11-22 ENCOUNTER — Ambulatory Visit: Payer: Self-pay

## 2023-11-22 ENCOUNTER — Encounter (HOSPITAL_COMMUNITY): Payer: Self-pay

## 2023-11-22 VITALS — BP 119/83 | HR 74 | Temp 98.2°F | Ht 61.0 in | Wt 122.0 lb

## 2023-11-22 DIAGNOSIS — Z0389 Encounter for observation for other suspected diseases and conditions ruled out: Secondary | ICD-10-CM | POA: Diagnosis not present

## 2023-11-22 DIAGNOSIS — R0781 Pleurodynia: Secondary | ICD-10-CM | POA: Insufficient documentation

## 2023-11-22 DIAGNOSIS — R079 Chest pain, unspecified: Secondary | ICD-10-CM | POA: Diagnosis not present

## 2023-11-22 DIAGNOSIS — R0789 Other chest pain: Secondary | ICD-10-CM | POA: Insufficient documentation

## 2023-11-22 LAB — CBC
HCT: 45.4 % (ref 36.0–46.0)
Hemoglobin: 15 g/dL (ref 12.0–15.0)
MCH: 32.1 pg (ref 26.0–34.0)
MCHC: 33 g/dL (ref 30.0–36.0)
MCV: 97 fL (ref 80.0–100.0)
Platelets: 255 10*3/uL (ref 150–400)
RBC: 4.68 MIL/uL (ref 3.87–5.11)
RDW: 12.6 % (ref 11.5–15.5)
WBC: 7.4 10*3/uL (ref 4.0–10.5)
nRBC: 0 % (ref 0.0–0.2)

## 2023-11-22 LAB — BASIC METABOLIC PANEL WITH GFR
Anion gap: 11 (ref 5–15)
BUN: 9 mg/dL (ref 6–20)
CO2: 25 mmol/L (ref 22–32)
Calcium: 9.2 mg/dL (ref 8.9–10.3)
Chloride: 102 mmol/L (ref 98–111)
Creatinine, Ser: 0.59 mg/dL (ref 0.44–1.00)
GFR, Estimated: >60 mL/min (ref >60)
Glucose, Bld: 83 mg/dL (ref 70–99)
Potassium: 4.5 mmol/L (ref 3.5–5.1)
Sodium: 138 mmol/L (ref 135–145)

## 2023-11-22 LAB — D-DIMER, QUANTITATIVE: D-Dimer, Quant: 0.74 ug{FEU}/mL — ABNORMAL HIGH (ref 0.00–0.50)

## 2023-11-22 LAB — TROPONIN I (HIGH SENSITIVITY): Troponin I (High Sensitivity): 2 ng/L (ref <18)

## 2023-11-22 MED ORDER — IOHEXOL 350 MG/ML SOLN
75.0000 mL | Freq: Once | INTRAVENOUS | Status: AC | PRN
  Administered 2023-11-22: 75 mL via INTRAVENOUS

## 2023-11-22 NOTE — Discharge Instructions (Signed)
 Please follow-up closely with your primary care doctor on an outpatient basis.  Return to emergency department immediately for any new or worsening symptoms.

## 2023-11-22 NOTE — Telephone Encounter (Signed)
 Copied from CRM [id number]. Topic: Clinical - Red Word Triage >> Nov 22, 2023  8:52 AM Briana Valdez wrote: Red Word that prompted transfer to Nurse Triage: Sharp stabbing pain under left breast when breathing  Chief Complaint: pain  Symptoms: severe, sharp pain under left breast only when taking deep breaths, soreness in collarbone Frequency: last night Pertinent Negatives: Patient denies fever Disposition: [] ED /[] Urgent Care (no appt availability in office) / [x] Appointment(In office/virtual)/ []  [facility]/ [] Home Care/ [] Refused Recommended Disposition /[] [facility]/ []  Follow-up with PCP Additional Notes: pt states the pain comes when taking deep breath only. Had surgery 09/24/2023 and returned to work last week  Reason for Disposition  [1] Chest pain lasts > 5 minutes AND [2] occurred > 3 days ago (72 hours) AND [3] NO chest pain or cardiac symptoms now    Chest pain - under left breast occurs only when taking deep breaths - started last night  Answer Assessment - Initial Assessment Questions 1. LOCATION: "Where does it hurt?"       Under left breast 2. RADIATION: "Does the pain go anywhere else?" (e.g., into neck, jaw, arms, back)     *No Answer* 3. ONSET: "When did the chest pain begin?" (Minutes, hours or days)      Last night 4. PATTERN: "Does the pain come and go, or has it been constant since it started?"  "Does it get worse with exertion?"      Comes and goes when taking a deep breath 5. DURATION: "How long does it last" (e.g., seconds, minutes, hours)     N/a 6. SEVERITY: "How bad is the pain?"  (e.g., Scale 1-10; mild, moderate, or severe)    - MILD (1-3): doesn't interfere with normal activities     - MODERATE (4-7): interferes with normal activities or awakens from sleep    - SEVERE (8-10): excruciating pain, unable to do any normal activities       Moderate to severe 7. CARDIAC RISK FACTORS: "Do you have any history of heart problems or risk factors  for heart disease?" (e.g., angina, prior heart attack; diabetes, high blood pressure, high cholesterol, smoker, or strong family history of heart disease)     *No Answer* 8. PULMONARY RISK FACTORS: "Do you have any history of lung disease?"  (e.g., blood clots in lung, asthma, emphysema, birth control pills)     *No Answer* 9. CAUSE: "What do you think is causing the chest pain?"     unknown 10. OTHER SYMPTOMS: "Do you have any other symptoms?" (e.g., dizziness, nausea, vomiting, sweating, fever, difficulty breathing, cough)       Drainage in back of throat 11. PREGNANCY: "Is there any chance you are pregnant?" "When was your last menstrual period?"       N/a  Protocols used: Chest Pain-A-AH

## 2023-11-22 NOTE — ED Provider Notes (Signed)
 [place] EMERGENCY DEPARTMENT AT [facility] Provider Note   CSN: [id number] Arrival date & time: 11/22/23  1743     History  Chief Complaint  Patient presents with   possible blood clot    Briana Valdez is a 43 y.o. female.  Patient is a 43 year old female who presents emergency department the chief complaint of left-sided chest pain which is worse with deep inspiration which has been ongoing since last night.  She does note that she had surgery for hysterectomy approximately 1 month ago.  She was evaluated by her primary care doctor today and noted to have an elevated D-dimer and was sent to the emergency department for further evaluation.  Patient denies any active pain at this time.  She has had no abdominal pain, nausea, vomiting, diarrhea.  She denies any known history of cardiac disease.        Home Medications Prior to Admission medications   Medication Sig Start Date End Date Taking? Authorizing Provider  cabergoline (DOSTINEX) 0.5 MG tablet Take 0.5 tablets (0.25 mg total) by mouth once a week. Patient taking differently: Take 0.25 mg by mouth once a week. Friday's 09/18/23   Shamleffer, Konrad Dolores, MD  docusate sodium (COLACE) 50 MG capsule Take 50 mg by mouth daily.    [provider]  estradiol (ESTRACE) 0.1 MG/GM vaginal cream Place 0.5g nightly for two weeks then twice a week after 11/08/23   Marguerita Beards, MD  nystatin (MYCOSTATIN) 100000 UNIT/ML suspension Take 5 mLs (500,000 Units total) by mouth 4 (four) times daily. 11/09/23   Margaretann Loveless, PA-C  phenylephrine (SUDAFED PE) 10 MG TABS tablet Take 10 mg by mouth every 4 (four) hours as needed.    [provider]      Allergies    Patient has no known allergies.    Review of Systems   Review of Systems  Respiratory:  Positive for chest tightness.   All other systems reviewed and are negative.   Physical Exam Updated Vital Signs BP 120/81   Pulse 71    Temp 98.9 F (37.2 C) (Oral)   Resp 20   Ht 5\' 1"  (1.549 m)   Wt 55.3 kg   SpO2 100%   BMI 23.05 kg/m  Physical Exam Vitals and nursing note reviewed.  Constitutional:      Appearance: Normal appearance.  HENT:     Head: Normocephalic and atraumatic.     Nose: Nose normal.     Mouth/Throat:     Mouth: Mucous membranes are moist.  Eyes:     Extraocular Movements: Extraocular movements intact.     Conjunctiva/sclera: Conjunctivae normal.     Pupils: Pupils are equal, round, and reactive to light.  Cardiovascular:     Rate and Rhythm: Normal rate and regular rhythm.     Pulses: Normal pulses.     Heart sounds: Normal heart sounds. No murmur heard.    No gallop.  Pulmonary:     Effort: Pulmonary effort is normal. No respiratory distress.     Breath sounds: Normal breath sounds. No stridor. No wheezing, rhonchi or rales.  Abdominal:     General: Abdomen is flat. Bowel sounds are normal. There is no distension.     Palpations: Abdomen is soft.     Tenderness: There is no abdominal tenderness. There is no guarding.  Musculoskeletal:        General: Normal range of motion.     Cervical back: Normal range  of motion and neck supple.     Right lower leg: No edema.     Left lower leg: No edema.  Skin:    General: Skin is warm and dry.  Neurological:     General: No focal deficit present.     Mental Status: She is alert and oriented to person, place, and time. Mental status is at baseline.  Psychiatric:        Mood and Affect: Mood normal.        Behavior: Behavior normal.        Thought Content: Thought content normal.        Judgment: Judgment normal.     ED Results / Procedures / Treatments   Labs (all labs ordered are listed, but only abnormal results are displayed) Labs Reviewed  CBC  BASIC METABOLIC PANEL WITH GFR  TROPONIN I (HIGH SENSITIVITY)  TROPONIN I (HIGH SENSITIVITY)    EKG None  Radiology CT Angio Chest PE W and/or Wo Contrast Result Date:  11/22/2023 CLINICAL DATA:  Pulmonary embolism (PE) suspected, low to intermediate prob, positive D-dimer EXAM: CT ANGIOGRAPHY CHEST WITH CONTRAST TECHNIQUE: Multidetector CT imaging of the chest was performed using the standard protocol during bolus administration of intravenous contrast. Multiplanar CT image reconstructions and MIPs were obtained to evaluate the vascular anatomy. RADIATION DOSE REDUCTION: This exam was performed according to the departmental dose-optimization program which includes automated exposure control, adjustment of the mA and/or kV according to patient size and/or use of iterative reconstruction technique. CONTRAST:  75mL OMNIPAQUE IOHEXOL 350 MG/ML SOLN COMPARISON:  None Available. FINDINGS: Cardiovascular: Satisfactory opacification of the pulmonary arteries to the segmental level. No evidence of pulmonary embolism. Normal heart size. No pericardial effusion. Mediastinum/Nodes: No enlarged mediastinal, hilar, or axillary lymph nodes. Thyroid gland, trachea, and esophagus demonstrate no significant findings. Lungs/Pleura: Lungs are clear. No pleural effusion or pneumothorax. Upper Abdomen: No acute abnormality. Musculoskeletal: No chest wall abnormality. Bilateral breast prostheses. No acute or significant osseous findings. Review of the MIP images confirms the above findings. IMPRESSION: No evidence of PE, aneurysm or dissection. Electronically Signed   By: Layla Maw M.D.   On: 11/22/2023 21:47   DG Chest 2 View Result Date: 11/22/2023 CLINICAL DATA:  Paretic chest pain for 1 day EXAM: CHEST - 2 VIEW COMPARISON:  08/15/2008 FINDINGS: Midline trachea. Normal heart size and mediastinal contours. No pleural effusion or pneumothorax. Clear lungs. bilateral breast implants. IMPRESSION: No acute cardiopulmonary disease. Electronically Signed   By: Jeronimo Greaves M.D.   On: 11/22/2023 17:16    Procedures Procedures    Medications Ordered in ED Medications  iohexol (OMNIPAQUE) 350  MG/ML injection 75 mL (75 mLs Intravenous Contrast Given 11/22/23 2131)    ED Course/ Medical Decision Making/ A&P                                 Medical Decision Making Amount and/or Complexity of Data Reviewed Labs: ordered.  Risk Prescription drug management.   This patient presents to the ED for concern of chest pain differential diagnosis includes ACS, pulmonary embolus, pericarditis, myocarditis, endocarditis, pneumothorax, hemothorax, pneumonia    Additional history obtained:  Additional history obtained from medical records External records from outside source obtained and reviewed including none   Lab Tests:  I Ordered, and personally interpreted labs.  The pertinent results include: Normal troponin, normal electrolytes and kidney function, no leukocytosis or anemia   Imaging  Studies ordered:  I ordered imaging studies including CTA of the chest I independently visualized and interpreted imaging which showed no pulmonary embolus, aortic aneurysm or dissection I agree with the radiologist interpretation   Problem List / ED Course:  Patient is doing well at this time and is stable for discharge home.  Discussed with patient that CT of the chest demonstrated no indication for acute pulmonary embolus.  Do not suspect ACS at this time as patient has negative troponin and EKG with no acute ischemic changes.  Patient has no other acute changes noted on blood work at this time.  Symptoms not positional in nature and do not suspect pericarditis or myocarditis.  Patient had no indication for aortic aneurysm or dissection.  The need for continued close follow-up with her primary care doctor on outpatient basis was discussed as well as strict turn precautions for any new or worsening symptoms.  Patient voiced understanding and had no additional questions.   Social Determinants of Health:  None           Final Clinical Impression(s) / ED Diagnoses Final diagnoses:   Chest pain, atypical    Rx / DC Orders ED Discharge Orders     None         Lelon Perla, PA-C 11/22/23 2231    Lonell Grandchild, MD 11/23/23 1114

## 2023-11-22 NOTE — ED Triage Notes (Signed)
 Pt arrived via POV from Dr Lorin Picket [facility] office for further evaluation of possible PE. Pt endorses Left Side pleuritic chest pain that began last night and pain is worse with deep inspirations. Pt also reports her D-Dimer is 0.74 and Pt recently had a surgery.

## 2023-11-22 NOTE — ED Notes (Signed)
 Patient transported to CT

## 2023-11-22 NOTE — Telephone Encounter (Addendum)
 Noted- Appt scheduled today with provider

## 2023-11-22 NOTE — Progress Notes (Signed)
 Subjective:    Patient ID: Briana Valdez, female    DOB: 14-Feb-1981, 43 y.o.   MRN: [id number]  HPI C/o pain under left breast ate supper last night ( pizza and fermented veggies) and had sharp stabbing pain and spasms , she was able to sleep through it after taking multiple acid meds but pain was still present in the morning   Pain today is under her left breast, rib area and across collar bone  And worse with inhalation    Pt had thrush last week as well as fever blisters  8 weeks ago had a hysterectomy   Discussed the use of AI scribe software for clinical note transcription with the patient, who gave verbal consent to proceed.  History of Present Illness   Briana Valdez is a 43 year old female who presents with severe sharp pain with deep breathing.   She experiences severe sharp pain with deep breathing that began last night. The pain is described as stabbing and sharp, located in the chest and radiating to the shoulder area. It is exacerbated by deep breaths and certain movements, such as turning in bed, and is relieved somewhat by sitting still. No shortness of breath, but she avoids deep breathing due to the pain.  The pain began after a normal workday where she wore a strapless bra and experienced some back discomfort, initially attributed to tightness. She used a heated car seat during lunch for relief. The severe pain started after her evening routine, including a shower and getting into bed.  She attempted to alleviate the pain with an over-the-counter acid reducer and some white liquid provided by her partner, Genevie Cheshire, but these did not provide significant relief. During the night, she propped herself up with pillows to sleep, which helped reduce the pain. This morning, the pain persisted but was less sharp than the previous night.  She experienced diarrhea twice this morning, which she attributes to nerves or possibly dietary changes, as she consumed fermented vegetables  the previous night.  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 She has been cautious with lifting post-hysterectomy, adhering to advice to lift no more than ten pounds. She has not taken any ibuprofen or naproxen until this morning.   Had major surgery about 7-8 weeks ago       Review of Systems     Objective:   Physical Exam HEENT is benign Lungs are clear no crackles Subjective discomfort with deep breath on the left side Subjective tenderness in the ribs near the left breast Abdomen soft no tenderness Chaperone nurse present       Assessment & Plan:  Assessment and Plan    Pleuritic Chest Pain Acute sharp chest pain with differential diagnosis including musculoskeletal pain, pneumothorax, and pulmonary embolism. Recent hysterectomy increases embolism risk. - Order chest X-ray for pneumothorax evaluation. - Order D-dimer test for pulmonary embolism assessment. - Instruct her to go directly to the hospital lab for blood draw. - Plan CT scan of lungs if D-dimer is abnormal. - Discuss potential anticoagulant treatment if embolism confirmed. - Advise ibuprofen or naproxen for pain.    Post-Hysterectomy Recovery Adjusting work pace and engaging in light exercise. Avoiding heavy lifting as per post-surgical advice. - Encourage moderation in work activities. - Advise continuation of light exercise and avoidance of heavy lifting.      D-dimer came back elevated With her history of surgery several  weeks ago and pleuritic pain with deep breath for no apparent reason it is recommended to go to the ER for further evaluation to rule out the possibility of pulmonary embolism Patient is aware of this and is willing to go Chest x-ray normal  Triage was spoken with

## 2023-11-23 ENCOUNTER — Encounter: Payer: Self-pay | Admitting: Family Medicine

## 2023-11-26 ENCOUNTER — Encounter: Payer: Self-pay | Admitting: Family Medicine

## 2023-11-26 ENCOUNTER — Ambulatory Visit: Payer: BC Managed Care – PPO | Admitting: Family Medicine

## 2023-11-26 VITALS — BP 101/68 | HR 65 | Temp 97.7°F | Ht 61.0 in | Wt 120.0 lb

## 2023-11-26 DIAGNOSIS — Z13220 Encounter for screening for lipoid disorders: Secondary | ICD-10-CM | POA: Diagnosis not present

## 2023-11-26 DIAGNOSIS — Z0001 Encounter for general adult medical examination with abnormal findings: Secondary | ICD-10-CM | POA: Diagnosis not present

## 2023-11-26 DIAGNOSIS — I700 Atherosclerosis of aorta: Secondary | ICD-10-CM

## 2023-11-26 DIAGNOSIS — Z0000 Encounter for general adult medical examination without abnormal findings: Secondary | ICD-10-CM

## 2023-11-26 NOTE — Progress Notes (Signed)
   Subjective:    Patient ID: Briana Valdez, female    DOB: 09-04-1980, 43 y.o.   MRN: [id number]  HPI The patient comes in today for a wellness visit.    A review of their health history was completed.  A review of medications was also completed. Here today for wellness Overall doing well Recently had hysterectomy Still has her ovaries She did have a MRI of the liver because of liver cysts these appear benign.  No further MRI indicated Did show a potential ovarian cyst This was alerted to the patient she states that she will connect with her gynecologist in the near future to have them do a GYN  exam as well as ultrasound Any needed refills; none  Eating habits: excellent  Falls/  MVA accidents in past few months: no  Regular exercise: some  Specialist pt sees on regular basis: uro-gyn- recent hysterctomy  Preventative health issues were discussed.   Additional concerns: none    Review of Systems     Objective:   Physical Exam General-in no acute distress Eyes-no discharge Lungs-respiratory rate normal, CTA CV-no murmurs,RRR Extremities skin warm dry no edema Neuro grossly normal Behavior normal, alert        Assessment & Plan:  1. Well adult exam (Primary) Adult wellness-complete.wellness physical was conducted today. Importance of diet and exercise were discussed in detail.  Importance of stress reduction and healthy living were discussed.  In addition to this a discussion regarding safety was also covered.  We also reviewed over immunizations and gave recommendations regarding current immunization needed for age.   In addition to this additional areas were also touched on including: Preventative health exams needed:  Colonoscopy she had a colonoscopy in 2022  Patient was advised yearly wellness exam  - Lipid Panel - Lipoprotein A (LPA) Patient gets her mammograms yearly through her gynecologist 2. Screening, lipid Check lab work Patient at  higher risk of having progression of aortic atherosclerosis She would like to use dietary measures to get LDL below 70 if possible Should do the lab work in the summertime potentially will need to start a statin - Lipid Panel - Lipoprotein A (LPA)  Aortic atherosclerosis-this was found on a previous scan patient without any cardiac symptoms

## 2023-12-09 ENCOUNTER — Encounter: Payer: Self-pay | Admitting: Obstetrics and Gynecology

## 2023-12-10 ENCOUNTER — Ambulatory Visit (INDEPENDENT_AMBULATORY_CARE_PROVIDER_SITE_OTHER)

## 2023-12-10 VITALS — BP 123/86 | HR 74 | Temp 98.4°F

## 2023-12-10 DIAGNOSIS — R350 Frequency of micturition: Secondary | ICD-10-CM

## 2023-12-10 DIAGNOSIS — R300 Dysuria: Secondary | ICD-10-CM

## 2023-12-10 NOTE — Progress Notes (Signed)
 Briana Valdez arrived today with dysuria and urinary frequency. Patient is notexperiencing fever, unstable vitals and/or one-sided back flank pain. Patient has not had had a recent hospitalization due to UTI.  Last visit in the office was 11/06/2023.  Per protocol:   The most recent Urinalysis completed on 09-13-2023 and was normal.  Last Creatinine level  Lab Results  Component Value Date   CREATININE 0.59 11/22/2023    An urine specimen was collected and POCT urinalysis completed. [] A cath specimen was collected due to patient's current condition, symptoms or post-procedural state.  Total urine output by catheter is @OUTPUT @  .    POCT Urine results is normal.  Urine micro was not sent per protocol for abnormal urinalysis.  Urine culture was not sent per protocol for abnormal urinalysis.     [] Pt was notified of positive urine results and plan for additional urine testing. We will contact you within the next 3-4 days with these results.  [] No Prescription was sent to your pharmacy.  The additional testing will indicate if a prescription is needed.   [] Patient was notified of abnormal urine results. The following prescription is sent to your preferred pharmacy.  []  Macrobid  100mg  #10 1 tablet by mouth twice daily with food for 5 days      []  Bactrim DS 800-160mg  #6 1 tablet by mouth twice daily for 3 days        []  Due to your current medication allergies, an alternate prescription was discussed with your provider and will be prescribed and sent to your pharmacy.  [] You can take over the counter AZO two tablets up to three times a day for two days.  Take AZO tablets with a full glass of water . AZO will turn your urine orange, this is normal.   [x] The patient was notified of negative urine results.  If symptoms persist, you may take over the counter AZO two tablets up to three times a day for two days.  AZO will turn your urine orange, this is normal.  Contact the office back to schedule an  appointment if your symptoms persist or worsen or you develop additional symptoms.       CC'd note to patient's provider.

## 2023-12-10 NOTE — Patient Instructions (Addendum)
 Please keep all scheduled follow ups.  It was a pleasure to see you today!  Thank you for trusting me with your urogynecology care!

## 2023-12-13 ENCOUNTER — Ambulatory Visit (INDEPENDENT_AMBULATORY_CARE_PROVIDER_SITE_OTHER): Admitting: Obstetrics and Gynecology

## 2023-12-13 ENCOUNTER — Encounter: Payer: Self-pay | Admitting: Obstetrics and Gynecology

## 2023-12-13 VITALS — BP 121/75 | HR 76

## 2023-12-13 DIAGNOSIS — T8130XA Disruption of wound, unspecified, initial encounter: Secondary | ICD-10-CM

## 2023-12-13 DIAGNOSIS — T8131XA Disruption of external operation (surgical) wound, not elsewhere classified, initial encounter: Secondary | ICD-10-CM

## 2023-12-13 DIAGNOSIS — Z48816 Encounter for surgical aftercare following surgery on the genitourinary system: Secondary | ICD-10-CM

## 2023-12-13 MED ORDER — LIDOCAINE-EPINEPHRINE 1 %-1:100000 IJ SOLN
9.0000 mL | Freq: Once | INTRAMUSCULAR | Status: AC
  Administered 2023-12-13: 9 mL

## 2023-12-13 MED ORDER — CEPHALEXIN 500 MG PO CAPS
500.0000 mg | ORAL_CAPSULE | Freq: Once | ORAL | Status: AC
  Administered 2023-12-13: 500 mg via ORAL

## 2023-12-13 NOTE — Progress Notes (Signed)
 [facility]  Date of Visit: 12/13/2023  History of Present Illness: Briana Valdez is a 43 y.o. female scheduled today for a post-operative visit.   Surgery: s/p Total vaginal hysterectomy with bilateral salpingectomy, uterosacral ligament suspension, anterior and posterior repair with perineorrhaphy, cystoscopy, urethral bulking (Bulkamid) on 09/24/23  She passed her postoperative void trial.   Postoperative course has been uncomplicated. Was treated for BV.   Had an episode of bladder urgency and pain. Did have some leakage after drinking a flavored drink.  Now after urinating and wiping, has a bit of a burning sensation. She is using the estrogen cream twice a week now. Denies any vaginal bleeding or discharge.   Medications: She has a current medication list which includes the following prescription(s): cabergoline , docusate sodium, estradiol , nystatin , and phenylephrine .   Allergies: Patient has no known allergies.   Physical Exam: BP 121/75   Pulse 76    Pelvic Examination: Perineal incision- epithelium has healed with several skin bridges/ holes and there is a gap between the subcutaneous tissue edges. Pt consented to office repair. Area cleaned with betadine and injected with 9cc of 1% lidocaine  with epinephrine . Skin bridges were cut. The subcutaneous tissue was freshened with scissors and silver nitrate was placed. Interrupted 3-0 vicryl sutures were placed to close the subcutaneous tissue. The perineal skin was then closed with horizontal mattress sutures. Good hemostasis noted. The patient tolerated well.   Vagina: incisions healed. Sutures are present at the cuff and there is not granulation tissue.   ---------------------------------------------------------  Assessment and Plan:  1. Dehiscence of perineal wound     - Continue estrace  cream over the perineum every other day.  - Avoid strenuous activity until sutures heal. Will plan to reexamine in 2-3 weeks. We  discussed that if this did not close well then would recommend repair in OR.    Briana Lamp, MD

## 2023-12-15 ENCOUNTER — Encounter: Payer: Self-pay | Admitting: Obstetrics and Gynecology

## 2023-12-17 ENCOUNTER — Ambulatory Visit: Payer: BC Managed Care – PPO | Admitting: Family Medicine

## 2024-01-01 ENCOUNTER — Ambulatory Visit (INDEPENDENT_AMBULATORY_CARE_PROVIDER_SITE_OTHER): Admitting: Obstetrics and Gynecology

## 2024-01-01 ENCOUNTER — Encounter: Payer: Self-pay | Admitting: Obstetrics and Gynecology

## 2024-01-01 VITALS — BP 123/79 | HR 66

## 2024-01-01 DIAGNOSIS — T8130XA Disruption of wound, unspecified, initial encounter: Secondary | ICD-10-CM | POA: Diagnosis not present

## 2024-01-01 DIAGNOSIS — Z48816 Encounter for surgical aftercare following surgery on the genitourinary system: Secondary | ICD-10-CM | POA: Diagnosis not present

## 2024-01-01 NOTE — Progress Notes (Signed)
 [facility]  Date of Visit: 01/01/2024  History of Present Illness: Ms. Faga is a 43 y.o. female scheduled today for a post-operative visit.   Surgery: s/p Total vaginal hysterectomy with bilateral salpingectomy, uterosacral ligament suspension, anterior and posterior repair with perineorrhaphy, cystoscopy, urethral bulking (Bulkamid) on 09/24/23  Bladder urgency has resolved. She has been using the estrace  cream every other day. No vaginal bleeding noted and pain has improved a lot.   Medications: She has a current medication list which includes the following prescription(s): cabergoline , docusate sodium, estradiol , nystatin , and phenylephrine .   Allergies: Patient has no known allergies.   Physical Exam: BP 123/79   Pulse 66    Pelvic Examination: Posterior vaginal edge mucosa with 0.5cm gap, sutures present but not approximating tissue. No erythema, discharge or granulation tissue present.   ---------------------------------------------------------  Assessment and Plan:  1. Dehiscence of perineal wound     - Continue estrace  cream over the perineum every other day.  - She may have a little more ingrowth by secondary intention but may need repair in OR. She has vacations planned in July so would recommend waiting until after that to return to OR if needed. Will plan to reexamine in 4-6 weeks to determine next steps if needed.    Arma Lamp, MD   Time spent: I spent 20 minutes dedicated to the care of this patient on the date of this encounter to include pre-visit review of records, face-to-face time with the patient and post visit documentation.

## 2024-01-25 ENCOUNTER — Telehealth: Admitting: Physician Assistant

## 2024-01-25 DIAGNOSIS — B9689 Other specified bacterial agents as the cause of diseases classified elsewhere: Secondary | ICD-10-CM | POA: Diagnosis not present

## 2024-01-25 DIAGNOSIS — J019 Acute sinusitis, unspecified: Secondary | ICD-10-CM | POA: Diagnosis not present

## 2024-01-25 MED ORDER — AMOXICILLIN-POT CLAVULANATE 875-125 MG PO TABS: 1.0000 | ORAL_TABLET | Freq: Two times a day (BID) | ORAL | 0 refills | Status: DC

## 2024-01-25 NOTE — Progress Notes (Signed)

## 2024-01-27 ENCOUNTER — Encounter: Payer: Self-pay | Admitting: Obstetrics and Gynecology

## 2024-02-01 ENCOUNTER — Other Ambulatory Visit (HOSPITAL_COMMUNITY)
Admission: RE | Admit: 2024-02-01 | Discharge: 2024-02-01 | Disposition: A | Discharge status: Home / Self Care | Source: Ambulatory Visit | Attending: Obstetrics and Gynecology | Admitting: Obstetrics and Gynecology

## 2024-02-01 ENCOUNTER — Encounter: Payer: Self-pay | Admitting: Obstetrics and Gynecology

## 2024-02-01 ENCOUNTER — Ambulatory Visit: Admitting: Obstetrics and Gynecology

## 2024-02-01 VITALS — BP 120/79 | HR 97

## 2024-02-01 DIAGNOSIS — N898 Other specified noninflammatory disorders of vagina: Secondary | ICD-10-CM | POA: Diagnosis not present

## 2024-02-01 DIAGNOSIS — T8130XA Disruption of wound, unspecified, initial encounter: Secondary | ICD-10-CM

## 2024-02-01 NOTE — Progress Notes (Signed)
 [facility]  Date of Visit: 02/01/2024  History of Present Illness: Briana Valdez is a 43 y.o. female scheduled today for a post-operative visit.   Surgery: s/p Total vaginal hysterectomy with bilateral salpingectomy, uterosacral ligament suspension, anterior and posterior repair with perineorrhaphy, cystoscopy, urethral bulking (Bulkamid) on 09/24/23  Pain has resolved. Sometimes has some discomfort after emptying bladder. Not sure if she had a UTI- has not had them frequently before. Denies urgency unless she has caffeine. Bowel movements are regular daily. Has not had any vaginal bleeding or discharge. Has been using the estrace  cream every other day.   Has some occasional lower abdominal/ pelvic pain. Has an US  scheduled next week at [facility].   Medications: She has a current medication list which includes the following prescription(s): amoxicillin -clavulanate, cabergoline , docusate sodium, estradiol , phenylephrine , and nystatin .   Allergies: Patient has no known allergies.   Physical Exam: BP 120/79   Pulse 97    Pelvic Examination: Posterior vaginal edge/ perineal tissue with skin bridging. No erythema, discharge or granulation tissue present.  On speculum, sutures present at cuff, thick white discharge present, aptima obtained ---------------------------------------------------------  Assessment and Plan:  1. Dehiscence of perineal wound   2. Vaginal discharge     - Aptima obtained due to vaginal discharge.  - We discussed perineal repair in the OR.  We discussed benefits vs risks of bleeding, infection, damage to surrounding organs. Advised 6 weeks of limited activity to allow healing post op, although ok to return to work after a few days as long as pain is controlled.  - Can continue estrace  cream twice a week  Surgery request sent   Arma Lamp, MD

## 2024-02-04 ENCOUNTER — Ambulatory Visit: Payer: Self-pay | Admitting: Obstetrics and Gynecology

## 2024-02-04 DIAGNOSIS — B9689 Other specified bacterial agents as the cause of diseases classified elsewhere: Secondary | ICD-10-CM

## 2024-02-04 DIAGNOSIS — N83202 Unspecified ovarian cyst, left side: Secondary | ICD-10-CM | POA: Diagnosis not present

## 2024-02-04 LAB — CERVICOVAGINAL ANCILLARY ONLY
Bacterial Vaginitis (gardnerella): POSITIVE — AB
Candida Glabrata: NEGATIVE
Candida Vaginitis: NEGATIVE
Comment: NEGATIVE
Comment: NEGATIVE
Comment: NEGATIVE

## 2024-02-04 MED ORDER — METRONIDAZOLE 500 MG PO TABS: 500.0000 mg | ORAL_TABLET | Freq: Two times a day (BID) | ORAL | 0 refills | Status: AC

## 2024-02-04 NOTE — Progress Notes (Signed)
 Patient has been notified

## 2024-02-10 ENCOUNTER — Encounter: Payer: Self-pay | Admitting: Obstetrics and Gynecology

## 2024-02-15 ENCOUNTER — Ambulatory Visit: Admitting: Obstetrics and Gynecology

## 2024-02-25 DIAGNOSIS — E237 Disorder of pituitary gland, unspecified: Secondary | ICD-10-CM | POA: Diagnosis not present

## 2024-02-26 ENCOUNTER — Encounter: Payer: Self-pay | Admitting: Family Medicine

## 2024-02-26 NOTE — Telephone Encounter (Signed)
 Nurses It would be wise for us  to see Porschea on Wednesday or Thursday Please reach out to her to set up the appointment thank you

## 2024-02-28 ENCOUNTER — Encounter: Payer: Self-pay | Admitting: Family Medicine

## 2024-02-29 ENCOUNTER — Ambulatory Visit: Admitting: Physician Assistant

## 2024-02-29 ENCOUNTER — Encounter: Payer: Self-pay | Admitting: Obstetrics and Gynecology

## 2024-02-29 ENCOUNTER — Encounter: Payer: Self-pay | Admitting: Physician Assistant

## 2024-02-29 VITALS — BP 135/83 | HR 69 | Temp 98.6°F | Ht 61.0 in | Wt 120.0 lb

## 2024-02-29 DIAGNOSIS — R42 Dizziness and giddiness: Secondary | ICD-10-CM | POA: Diagnosis not present

## 2024-02-29 DIAGNOSIS — J320 Chronic maxillary sinusitis: Secondary | ICD-10-CM

## 2024-02-29 MED ORDER — MECLIZINE HCL 50 MG PO TABS: 50.0000 mg | ORAL_TABLET | Freq: Two times a day (BID) | ORAL | 0 refills | Status: DC | PRN

## 2024-02-29 MED ORDER — DOXYCYCLINE HYCLATE 100 MG PO TABS: 100.0000 mg | ORAL_TABLET | Freq: Two times a day (BID) | ORAL | 0 refills | Status: AC

## 2024-02-29 NOTE — Assessment & Plan Note (Signed)
 Patient presents today with sinus pressure, facial pain, and dizziness. History of recurrent sinusitis. Overall benign exam. Advised continued symptomatic care. Meclizine  for dizziness as needed. Doxycyline prescribed as patient is going out of town for 2 weeks. Advised her to pick up/use prescription only if symptoms do not improve or worsen. Patient counseled on side effects, specifically photosensitivity. Warning signs and ER precautions discussed.

## 2024-02-29 NOTE — Progress Notes (Signed)
 Acute Office Visit  Subjective:     Patient ID: Briana Valdez, female    DOB: 04/03/1981, 43 y.o.   MRN: [id number]   Patient presents today with complaints of sinus pressure, facial pain, and dizziness. She reports symptoms have been ongoing intermittently for 2 weeks. She relates history of frequent sinusitis due to having a tooth puncturing her sinus cavity. Symptoms primarily on the right side of her face. Endorses occasional congestion. Denies fevers. Has been taking Sudafed and Mucinex OTC with moderate relief.  States nasal rinses are helpful for symptoms.      Review of Systems  Constitutional:  Negative for chills, fever and malaise/fatigue.  HENT:  Positive for congestion, ear pain and sinus pain. Negative for ear discharge and sore throat.   Eyes:  Negative for blurred vision and double vision.  Respiratory:  Negative for cough and shortness of breath.   Cardiovascular:  Negative for chest pain and palpitations.  Musculoskeletal:  Negative for myalgias.  Neurological:  Positive for headaches. Negative for tingling and seizures.        Objective:     BP 135/83   Pulse 69   Temp 98.6 F (37 C)   Ht 5' 1 (1.549 m)   Wt 120 lb (54.4 kg)   SpO2 100%   BMI 22.67 kg/m   Physical Exam Vitals reviewed.  Constitutional:      General: She is not in acute distress.    Appearance: Normal appearance. She is not ill-appearing.  HENT:     Right Ear: Tympanic membrane normal.     Left Ear: Tympanic membrane normal.     Nose: Congestion present.     Right Sinus: Maxillary sinus tenderness present. No frontal sinus tenderness.     Left Sinus: No maxillary sinus tenderness or frontal sinus tenderness.     Mouth/Throat:     Mouth: Mucous membranes are moist.     Pharynx: Oropharynx is clear.  Eyes:     Extraocular Movements: Extraocular movements intact.     Conjunctiva/sclera: Conjunctivae normal.  Cardiovascular:     Rate and Rhythm: Normal rate and regular  rhythm.     Heart sounds: Normal heart sounds. No murmur heard. Pulmonary:     Effort: Pulmonary effort is normal.     Breath sounds: Normal breath sounds. No wheezing, rhonchi or rales.  Lymphadenopathy:     Cervical: No cervical adenopathy.  Skin:    General: Skin is warm and dry.  Neurological:     General: No focal deficit present.     Mental Status: She is alert and oriented to person, place, and time.  Psychiatric:        Mood and Affect: Mood normal.        Behavior: Behavior normal.     No results found for any visits on 02/29/24.      Assessment & Plan:  Chronic maxillary sinusitis Assessment & Plan: Patient presents today with sinus pressure, facial pain, and dizziness. History of recurrent sinusitis. Overall benign exam. Advised continued symptomatic care. Meclizine  for dizziness as needed. Doxycyline prescribed as patient is going out of town for 2 weeks. Advised her to pick up/use prescription only if symptoms do not improve or worsen. Patient counseled on side effects, specifically photosensitivity. Warning signs and ER precautions discussed.   Orders: -     Doxycycline  Hyclate; Take 1 tablet (100 mg total) by mouth 2 (two) times daily for 7 days.  Dispense: 14 tablet; Refill: 0  Dizziness -     Meclizine  HCl; Take 1 tablet (50 mg total) by mouth 2 (two) times daily as needed.  Dispense: 30 tablet; Refill: 0    Return if symptoms worsen or fail to improve.  Charmaine Skylarr Liz, PA-C

## 2024-03-14 ENCOUNTER — Encounter (HOSPITAL_COMMUNITY): Payer: Self-pay | Admitting: Obstetrics and Gynecology

## 2024-03-14 ENCOUNTER — Encounter (HOSPITAL_COMMUNITY): Payer: Self-pay

## 2024-03-14 NOTE — Progress Notes (Signed)
 Spoke w/ via phone for pre-op interview--- Rosina Lab needs dos---- NONE. Surgeon orders requested 03/14/24.         Lab results------ Current EKG in Epic dated 11/27/23. COVID test -----patient states asymptomatic no test needed Arrive at -------0800 NPO after MN NO Solid Food.  Clear liquids from MN until---0700 Pre-Surgery Ensure or G2:  Med rec completed Medications to take morning of surgery -----NONE Diabetic medication -----  GLP1 agonist last dose: GLP1 instructions:  Patient instructed no nail polish to be worn day of surgery Patient instructed to bring photo id and insurance card day of surgery Patient aware to have Driver (ride ) / caregiver    for 24 hours after surgery - Husband Sherida Bars Patient Special Instructions ----- shower with antibacterial soap. Pre-Op special Instructions -----  Patient verbalized understanding of instructions that were given at this phone interview. Patient denies chest pain, sob, fever, cough at the interview.

## 2024-03-20 NOTE — H&P (Signed)
 [facility] Pre-Operative H&P  Subjective Chief Complaint: Briana Valdez presents for a preoperative encounter.   History of Present Illness: Briana Valdez is a 43 y.o. female who presents for preoperative visit.  She is scheduled to undergo revision of perineoplasty on 03/24/24.    She is s/p Total vaginal hysterectomy with bilateral salpingectomy, uterosacral ligament suspension, anterior and posterior repair with perineorrhaphy, cystoscopy, urethral bulking ([place]) on 09/24/23. Post-operatively, her perineal wound broke down. Attempt to repair was made in the office but not successful.   Past Medical History:  Diagnosis Date   Hemorrhoids    History of kidney stones    Pituitary microadenoma with hyperprolactinemia [facility]) 2018   endocrinologist--- dr sam; dx 2018;     last MRI 02/ 2024 in epic stable adenoma;  prolactin level normal while taking cabergoline    PONV (postoperative nausea and vomiting)    SUI (stress urinary incontinence, female)    Uterovaginal prolapse, incomplete      Past Surgical History:  Procedure Laterality Date   ANTERIOR AND POSTERIOR REPAIR N/A 09/24/2023   Procedure: ANTERIOR (CYSTOCELE) AND POSTERIOR REPAIR (RECTOCELE) with perineorrhaphy;  Surgeon: Marilynne Rosaline SAILOR, MD;  Location: [facility];  Service: Gynecology;  Laterality: N/A;   BREAST ENHANCEMENT SURGERY Bilateral 2010   w/ implants   COLONOSCOPY WITH PROPOFOL  N/A 10/18/2020   by dr cindie ;   Non-bleeding internal hemorrhoids, otherwise normal.   CYSTOSCOPY N/A 09/24/2023   Procedure: CYSTOSCOPY;  Surgeon: Marilynne Rosaline SAILOR, MD;  Location: [facility];  Service: Gynecology;  Laterality: N/A;   VAGINAL HYSTERECTOMY N/A 09/24/2023   Procedure: HYSTERECTOMY VAGINAL WITH BILATERAL SALPINGECTOMY;  Surgeon: Marilynne Rosaline SAILOR, MD;  Location: [facility];  Service: Gynecology;  Laterality: N/A;   VAGINAL PROLAPSE REPAIR N/A  09/24/2023   Procedure: UTEROSACRAL LIGAMENT SUSPENSION;  Surgeon: Marilynne Rosaline SAILOR, MD;  Location: [facility];  Service: Gynecology;  Laterality: N/A;    has no known allergies.   Family History  Problem Relation Age of Onset   Breast cancer Mother    Cancer Mother        breast   Diabetes Father    Hypertension Father    Liver disease Father    Breast cancer Maternal Grandmother    Breast cancer Paternal Grandmother    Colon cancer Paternal Grandmother    Colon polyps Neg Hx    Other Neg Hx     Social History   Tobacco Use   Smoking status: Never   Smokeless tobacco: Never  Vaping Use   Vaping status: Never Used  Substance Use Topics   Alcohol use: No   Drug use: Never     Review of Systems was negative for a full 10 system review except as noted in the History of Present Illness.  No current facility-administered medications for this encounter.  Current Outpatient Medications:    cabergoline  (DOSTINEX ) 0.5 MG tablet, Take 0.5 tablets (0.25 mg total) by mouth once a week., Disp: 10 tablet, Rfl: 3   docusate sodium (COLACE) 50 MG capsule, Take 50 mg by mouth daily., Disp: , Rfl:    estradiol  (ESTRACE ) 0.1 MG/GM vaginal cream, Place 0.5g nightly for two weeks then twice a week after, Disp: 42.5 g, Rfl: 1   meclizine  (ANTIVERT ) 50 MG tablet, Take 1 tablet (50 mg total) by mouth 2 (two) times daily as needed. (Patient not taking: Reported on 03/14/2024), Disp: 30 tablet, Rfl: 0   phenylephrine  (  SUDAFED PE) 10 MG TABS tablet, Take 10 mg by mouth every 4 (four) hours as needed., Disp: , Rfl:    Objective There were no vitals filed for this visit.  Gen: NAD CV: S1 S2 RRR Lungs: Clear to auscultation bilaterally Abd: soft, nontender   Previous Pelvic Exam showed: Posterior vaginal edge/ perineal tissue with skin bridging. No erythema, discharge or granulation tissue present.  On speculum, sutures present at cuff, thick white discharge present,  aptima obtained    Assessment/ Plan  Assessment: The patient is a 43 y.o. year old scheduled to undergo revision of perineoplasty.   Plan: General Surgical Consent: The patient has previously been counseled on alternative treatments, and the decision by the patient and provider was to proceed with the procedure listed above.  For all procedures, there are risks of bleeding, infection, damage to surrounding organs including but not limited to bowel, bladder, blood vessels, ureters and nerves, and need for further surgery if an injury were to occur. These risks are all low with minimally invasive surgery.   There are risks of numbness and weakness at any body site or buttock/rectal pain.  It is possible that baseline pain can be worsened by surgery, either with or without mesh. If surgery is vaginal, there is also a low risk of possible conversion to laparoscopy or open abdominal incision where indicated. Very rare risks include blood transfusion, blood clot, heart attack, pneumonia, or death.    We discussed consent for blood products. Risks for blood transfusion include allergic reactions, other reactions that can affect different body organs and managed accordingly, transmission of infectious diseases such as HIV or Hepatitis. However, the blood is screened.   Rosaline LOISE Caper, MD

## 2024-03-23 DIAGNOSIS — Z01818 Encounter for other preprocedural examination: Secondary | ICD-10-CM

## 2024-03-24 ENCOUNTER — Ambulatory Visit (HOSPITAL_COMMUNITY): Admitting: Certified Registered Nurse Anesthetist

## 2024-03-24 ENCOUNTER — Other Ambulatory Visit: Payer: Self-pay

## 2024-03-24 ENCOUNTER — Ambulatory Visit (HOSPITAL_COMMUNITY)
Admission: RE | Admit: 2024-03-24 | Discharge: 2024-03-24 | Disposition: A | Discharge status: Home / Self Care | Attending: Obstetrics and Gynecology | Admitting: Obstetrics and Gynecology

## 2024-03-24 ENCOUNTER — Other Ambulatory Visit (HOSPITAL_COMMUNITY): Payer: Self-pay

## 2024-03-24 ENCOUNTER — Telehealth: Payer: Self-pay | Admitting: Obstetrics and Gynecology

## 2024-03-24 ENCOUNTER — Encounter (HOSPITAL_COMMUNITY): Payer: Self-pay | Admitting: Obstetrics and Gynecology

## 2024-03-24 ENCOUNTER — Ambulatory Visit: Payer: BC Managed Care – PPO | Admitting: Urology

## 2024-03-24 ENCOUNTER — Encounter (HOSPITAL_COMMUNITY)
Admission: RE | Disposition: A | Discharge status: Home / Self Care | Payer: Self-pay | Source: Home / Self Care | Attending: Obstetrics and Gynecology

## 2024-03-24 DIAGNOSIS — T8130XA Disruption of wound, unspecified, initial encounter: Secondary | ICD-10-CM | POA: Diagnosis not present

## 2024-03-24 DIAGNOSIS — N9069 Other specified hypertrophy of vulva: Secondary | ICD-10-CM | POA: Diagnosis not present

## 2024-03-24 DIAGNOSIS — N8189 Other female genital prolapse: Secondary | ICD-10-CM | POA: Diagnosis not present

## 2024-03-24 DIAGNOSIS — Z01818 Encounter for other preprocedural examination: Secondary | ICD-10-CM

## 2024-03-24 DIAGNOSIS — Z86018 Personal history of other benign neoplasm: Secondary | ICD-10-CM | POA: Insufficient documentation

## 2024-03-24 DIAGNOSIS — Z90710 Acquired absence of both cervix and uterus: Secondary | ICD-10-CM | POA: Insufficient documentation

## 2024-03-24 DIAGNOSIS — Z9079 Acquired absence of other genital organ(s): Secondary | ICD-10-CM | POA: Insufficient documentation

## 2024-03-24 DIAGNOSIS — Y838 Other surgical procedures as the cause of abnormal reaction of the patient, or of later complication, without mention of misadventure at the time of the procedure: Secondary | ICD-10-CM | POA: Insufficient documentation

## 2024-03-24 DIAGNOSIS — T81329D Deep disruption or dehiscence of operation wound, unspecified, subsequent encounter: Secondary | ICD-10-CM

## 2024-03-24 HISTORY — DX: Personal history of urinary calculi: Z87.442

## 2024-03-24 HISTORY — PX: PERINEOPLASTY: SHX2218

## 2024-03-24 SURGERY — PERINEOPLASTY
Anesthesia: General

## 2024-03-24 MED ORDER — POLYETHYLENE GLYCOL 3350 17 GM/SCOOP PO POWD
17.0000 g | Freq: Every day | ORAL | 0 refills | Status: AC
  Filled 2024-03-24: qty 238, 14d supply, fill #0

## 2024-03-24 MED ORDER — KETOROLAC TROMETHAMINE 30 MG/ML IJ SOLN
INTRAMUSCULAR | Status: DC | PRN
Start: 2024-03-24 — End: 2024-03-24
  Administered 2024-03-24: 30 mg via INTRAVENOUS

## 2024-03-24 MED ORDER — ONDANSETRON HCL 4 MG/2ML IJ SOLN
INTRAMUSCULAR | Status: AC
  Filled 2024-03-24: qty 2

## 2024-03-24 MED ORDER — KETOROLAC TROMETHAMINE 30 MG/ML IJ SOLN: 30.0000 mg | Freq: Once | INTRAMUSCULAR | Status: DC | PRN

## 2024-03-24 MED ORDER — ACETAMINOPHEN 500 MG PO TABS
ORAL_TABLET | ORAL | Status: AC
  Filled 2024-03-24: qty 2

## 2024-03-24 MED ORDER — DEXMEDETOMIDINE HCL IN NACL 80 MCG/20ML IV SOLN
INTRAVENOUS | Status: DC | PRN
Start: 2024-03-24 — End: 2024-03-24
  Administered 2024-03-24: 4 ug via INTRAVENOUS

## 2024-03-24 MED ORDER — FENTANYL CITRATE (PF) 250 MCG/5ML IJ SOLN
INTRAMUSCULAR | Status: AC
  Filled 2024-03-24: qty 5

## 2024-03-24 MED ORDER — DEXAMETHASONE SODIUM PHOSPHATE 10 MG/ML IJ SOLN
INTRAMUSCULAR | Status: DC | PRN
  Administered 2024-03-24: 10 mg via INTRAVENOUS

## 2024-03-24 MED ORDER — LIDOCAINE-EPINEPHRINE 1 %-1:100000 IJ SOLN
INTRAMUSCULAR | Status: AC
  Filled 2024-03-24: qty 2

## 2024-03-24 MED ORDER — DEXAMETHASONE SODIUM PHOSPHATE 10 MG/ML IJ SOLN
INTRAMUSCULAR | Status: AC
  Filled 2024-03-24: qty 1

## 2024-03-24 MED ORDER — LIDOCAINE 2% (20 MG/ML) 5 ML SYRINGE
INTRAMUSCULAR | Status: AC
Start: 2024-03-24 — End: 2024-03-24
  Filled 2024-03-24: qty 5

## 2024-03-24 MED ORDER — ACETAMINOPHEN 500 MG PO TABS
1000.0000 mg | ORAL_TABLET | ORAL | Status: AC
  Administered 2024-03-24: 1000 mg via ORAL

## 2024-03-24 MED ORDER — 0.9 % SODIUM CHLORIDE (POUR BTL) OPTIME
TOPICAL | Status: DC | PRN
  Administered 2024-03-24: 1000 mL

## 2024-03-24 MED ORDER — IBUPROFEN 600 MG PO TABS
600.0000 mg | ORAL_TABLET | Freq: Four times a day (QID) | ORAL | 0 refills | Status: AC | PRN
  Filled 2024-03-24: qty 30, 8d supply, fill #0

## 2024-03-24 MED ORDER — CEFAZOLIN SODIUM-DEXTROSE 2-4 GM/100ML-% IV SOLN
2.0000 g | INTRAVENOUS | Status: AC
  Administered 2024-03-24: 2 g via INTRAVENOUS

## 2024-03-24 MED ORDER — PROPOFOL 10 MG/ML IV BOLUS
INTRAVENOUS | Status: AC
  Filled 2024-03-24: qty 20

## 2024-03-24 MED ORDER — PROPOFOL 10 MG/ML IV BOLUS
INTRAVENOUS | Status: DC | PRN
  Administered 2024-03-24: 40 mg via INTRAVENOUS
  Administered 2024-03-24: 200 mg via INTRAVENOUS
  Administered 2024-03-24: 150 ug/kg/min via INTRAVENOUS
  Administered 2024-03-24: 30 mg via INTRAVENOUS

## 2024-03-24 MED ORDER — LIDOCAINE 2% (20 MG/ML) 5 ML SYRINGE
INTRAMUSCULAR | Status: DC | PRN
  Administered 2024-03-24: 60 mg via INTRAVENOUS

## 2024-03-24 MED ORDER — MIDAZOLAM HCL 2 MG/2ML IJ SOLN
INTRAMUSCULAR | Status: DC | PRN
  Administered 2024-03-24: 2 mg via INTRAVENOUS

## 2024-03-24 MED ORDER — ORAL CARE MOUTH RINSE: 15.0000 mL | Freq: Once | OROMUCOSAL | Status: AC

## 2024-03-24 MED ORDER — CHLORHEXIDINE GLUCONATE 0.12 % MT SOLN
15.0000 mL | Freq: Once | OROMUCOSAL | Status: AC
  Administered 2024-03-24: 15 mL via OROMUCOSAL

## 2024-03-24 MED ORDER — ACETAMINOPHEN 500 MG PO TABS
500.0000 mg | ORAL_TABLET | Freq: Four times a day (QID) | ORAL | 0 refills | Status: AC | PRN
  Filled 2024-03-24: qty 30, 8d supply, fill #0

## 2024-03-24 MED ORDER — LIDOCAINE-EPINEPHRINE 1 %-1:100000 IJ SOLN
INTRAMUSCULAR | Status: DC | PRN
Start: 2024-03-24 — End: 2024-03-24
  Administered 2024-03-24: 10 mL

## 2024-03-24 MED ORDER — OXYCODONE HCL 5 MG PO TABS
5.0000 mg | ORAL_TABLET | ORAL | 0 refills | Status: DC | PRN
  Filled 2024-03-24: qty 5, 1d supply, fill #0

## 2024-03-24 MED ORDER — OXYCODONE HCL 5 MG PO TABS: 5.0000 mg | ORAL_TABLET | Freq: Once | ORAL | Status: DC | PRN

## 2024-03-24 MED ORDER — ONDANSETRON HCL 4 MG/2ML IJ SOLN: 4.0000 mg | Freq: Once | INTRAMUSCULAR | Status: DC | PRN

## 2024-03-24 MED ORDER — MEPERIDINE HCL 25 MG/ML IJ SOLN: 6.2500 mg | INTRAMUSCULAR | Status: DC | PRN

## 2024-03-24 MED ORDER — DEXMEDETOMIDINE HCL IN NACL 80 MCG/20ML IV SOLN
INTRAVENOUS | Status: AC
Start: 2024-03-24 — End: 2024-03-24
  Filled 2024-03-24: qty 20

## 2024-03-24 MED ORDER — AMISULPRIDE (ANTIEMETIC) 5 MG/2ML IV SOLN: 10.0000 mg | Freq: Once | INTRAVENOUS | Status: DC | PRN

## 2024-03-24 MED ORDER — POVIDONE-IODINE 10 % EX SWAB: 2.0000 | Freq: Once | CUTANEOUS | Status: DC

## 2024-03-24 MED ORDER — MIDAZOLAM HCL 2 MG/2ML IJ SOLN
INTRAMUSCULAR | Status: AC
  Filled 2024-03-24: qty 2

## 2024-03-24 MED ORDER — ARTIFICIAL TEARS OPHTHALMIC OINT
TOPICAL_OINTMENT | OPHTHALMIC | Status: AC
  Filled 2024-03-24: qty 3.5

## 2024-03-24 MED ORDER — ONDANSETRON HCL 4 MG/2ML IJ SOLN
INTRAMUSCULAR | Status: DC | PRN
  Administered 2024-03-24: 4 mg via INTRAVENOUS

## 2024-03-24 MED ORDER — LACTATED RINGERS IV SOLN: INTRAVENOUS | Status: DC

## 2024-03-24 MED ORDER — CEFAZOLIN SODIUM-DEXTROSE 2-4 GM/100ML-% IV SOLN
INTRAVENOUS | Status: AC
  Filled 2024-03-24: qty 100

## 2024-03-24 MED ORDER — HYDROMORPHONE HCL 1 MG/ML IJ SOLN: 0.2500 mg | INTRAMUSCULAR | Status: DC | PRN

## 2024-03-24 MED ORDER — FENTANYL CITRATE (PF) 250 MCG/5ML IJ SOLN
INTRAMUSCULAR | Status: DC | PRN
  Administered 2024-03-24: 100 ug via INTRAVENOUS

## 2024-03-24 MED ORDER — OXYCODONE HCL 5 MG/5ML PO SOLN: 5.0000 mg | Freq: Once | ORAL | Status: DC | PRN

## 2024-03-24 MED ORDER — CHLORHEXIDINE GLUCONATE 0.12 % MT SOLN
OROMUCOSAL | Status: AC
  Filled 2024-03-24: qty 15

## 2024-03-24 SURGICAL SUPPLY — 27 items
BLADE SURG 15 STRL LF DISP TIS (BLADE) ×2 IMPLANT
CATH ROBINSON RED A/P 16FR (CATHETERS) IMPLANT
COVER MAYO STAND STRL (DRAPES) ×2 IMPLANT
GAUZE 4X4 16PLY ~~LOC~~+RFID DBL (SPONGE) IMPLANT
GLOVE BIOGEL PI IND STRL 6.5 (GLOVE) ×2 IMPLANT
GLOVE BIOGEL PI IND STRL 7.0 (GLOVE) ×2 IMPLANT
GLOVE ECLIPSE 6.0 STRL STRAW (GLOVE) ×2 IMPLANT
GOWN STRL REUS W/ TWL LRG LVL3 (GOWN DISPOSABLE) ×2 IMPLANT
HIBICLENS CHG 4% 4OZ (MISCELLANEOUS) ×2 IMPLANT
HOLDER FOLEY CATH W/STRAP (MISCELLANEOUS) ×2 IMPLANT
KIT TURNOVER KIT B (KITS) ×2 IMPLANT
NDL HYPO 22X1.5 SAFETY MO (MISCELLANEOUS) ×2 IMPLANT
NEEDLE HYPO 22X1.5 SAFETY MO (MISCELLANEOUS) ×1 IMPLANT
NS IRRIG 1000ML POUR BTL (IV SOLUTION) ×2 IMPLANT
PACK VAGINAL WOMENS (CUSTOM PROCEDURE TRAY) ×2 IMPLANT
PAD OB MATERNITY 11 LF (PERSONAL CARE ITEMS) ×2 IMPLANT
SLEEVE SCD COMPRESS KNEE MED (STOCKING) ×2 IMPLANT
SPIKE FLUID TRANSFER (MISCELLANEOUS) IMPLANT
SUCTION TUBE FRAZIER 10FR DISP (SUCTIONS) ×2 IMPLANT
SUT MON AB 2-0 SH27 (SUTURE) IMPLANT
SUT PDS AB 0 CT1 27 (SUTURE) IMPLANT
SUT VIC AB 0 CT1 27XBRD ANBCTR (SUTURE) ×2 IMPLANT
SUT VIC AB 2-0 SH 27X BRD (SUTURE) IMPLANT
SUT VIC AB 2-0 SH 27XBRD (SUTURE) ×2 IMPLANT
SUT VICRYL 2-0 SH 8X27 (SUTURE) IMPLANT
SYR BULB EAR ULCER 3OZ GRN STR (SYRINGE) ×2 IMPLANT
TOWEL GREEN STERILE (TOWEL DISPOSABLE) ×2 IMPLANT

## 2024-03-24 NOTE — Interval H&P Note (Signed)
 History and Physical Interval Note:  03/24/2024 9:40 AM  Briana Valdez  has presented today for surgery, with the diagnosis of dehiscence of perineal wound.  The various methods of treatment have been discussed with the patient and family. After consideration of risks, benefits and other options for treatment, the patient has consented to  Procedure(s): PERINEOPLASTY (N/A) as a surgical intervention.  The patient's history has been reviewed, patient examined, no change in status, stable for surgery.  I have reviewed the patient's chart and labs.  Questions were answered to the patient's satisfaction.     Briana Valdez

## 2024-03-24 NOTE — Discharge Instructions (Addendum)
 DO NOT TAKE TYLENOL  UNTIL AFTER 2:45PM DO NOT TAKE MOTRIN  UNTIL AFTER 5:10PM   POST OPERATIVE INSTRUCTIONS  General Instructions Recovery (not bed rest) will last approximately 6 weeks Walking is encouraged, but refrain from strenuous exercise/ housework/ heavy lifting. No lifting >10lbs  Nothing in the vagina- NO intercourse, tampons or douching Bathing:  Do not submerge in water  (NO swimming, bath, hot tub, etc) until after your postop visit. You can shower starting the day after surgery.  No driving until you are not taking narcotic pain medicine and until your pain is well enough controlled that you can slam on the breaks or make sudden movements if needed.   Taking your medications Please take your acetaminophen  and ibuprofen  on a schedule for the first 48 hours. Take 600mg  ibuprofen , then take 500mg  acetaminophen  3 hours later, then continue to alternate ibuprofen  and acetaminophen . That way you are taking each type of medication every 6 hours. Take the prescribed narcotic (oxycodone , tramadol, etc) as needed, with a maximum being every 4 hours.  Take a stool softener daily to keep your stools soft and preventing you from straining. If you have diarrhea, you decrease your stool softener. This is explained more below. We have prescribed you Miralax . Start vaginal estrogen cream after 48 hours. Place a pea sized amount of the cream at the perineal incision line daily.   Reasons to Call the Nurse (see last page for phone numbers) Heavy Bleeding (changing your pad every 1-2 hours) Persistent nausea/vomiting Fever (100.4 degrees or more) Incision problems (pus or other fluid coming out, redness, warmth, increased pain)  Things to Expect After Surgery Mild to Moderate pain is normal during the first day or two after surgery. If prescribed, take Ibuprofen  or Tylenol  first and use the stronger medicine for "break-through" pain. You can overlap these medicines because they work differently.    Constipation   To Prevent Constipation:  Eat a well-balanced diet including protein, grains, fresh fruit and vegetables.  Drink plenty of fluids. Walk regularly.  Depending on specific instructions from your physician: take Miralax  daily and additionally you can add a stool softener (colace/ docusate) and fiber supplement. Continue as long as you're on pain medications.   To Treat Constipation:  If you do not have a bowel movement in 2 days after surgery, you can take 2 Tbs of Milk of Magnesia 1-2 times a day until you have a bowel movement. If diarrhea occurs, decrease the amount or stop the laxative. If no results with Milk of Magnesia, you can drink a bottle of magnesium citrate which you can purchase over the counter.  Fatigue:  This is a normal response to surgery and will improve with time.  Plan frequent rest periods throughout the day.  Gas Pain:  This is very common but can also be very painful! Drink warm liquids such as herbal teas, bouillon or soup. Walking will help you pass more gas.  Mylicon or Gas-X can be taken over the counter.  Leaking Urine:  Varying amounts of leakage may occur after surgery.  This should improve with time. Your bladder needs at least 3 months to recover from surgery. If you leak after surgery, be sure to mention this to your doctor at your post-op visit. If you were taking medications for overactive bladder prior to surgery, be sure to restart the medications immediately after surgery.  Incisions: If you have incisions on your abdomen, the skin glue will dissolve on its own over time. It is ok to  gently rinse with soap and water  over these incisions but do not scrub.  Catheter Approximately 50% of patients are unable to urinate after surgery and need to go home with a catheter. This allows your bladder to rest so it can return to full function. If you go home with a catheter, the office will call to set up a voiding trial a few days after surgery. For most  patients, by this visit, they are able to urinate on their own. Long term catheter use is rare.   Return to Work  As work demands and recovery times vary widely, it is hard to predict when you will want to return to work. If you have a desk job with no strenuous physical activity, and if you would like to return sooner than generally recommended, discuss this with your provider or call our office.   Post op concerns  For non-emergent issues, please call the Urogynecology Nurse. Please leave a message and someone will contact you within one business day.  You can also send a message through MyChart.   AFTER HOURS (After 5:00 PM and on weekends):  For urgent matters that cannot wait until the next business day. Call our office [phone] and connect to the doctor on call.  Please reserve this for important issues.   **FOR ANY TRUE EMERGENCY ISSUES CALL 911 OR GO TO THE NEAREST EMERGENCY ROOM.** Please inform our office or the doctor on call of any emergency.     APPOINTMENTS: Call [phone]  Post Anesthesia Home Care Instructions  Activity: Get plenty of rest for the remainder of the day. A responsible adult should stay with you for 24 hours following the procedure.  For the next 24 hours, DO NOT: -Drive a car -Advertising copywriter -Drink alcoholic beverages -Take any medication unless instructed by your physician -Make any legal decisions or sign important papers.  Meals: Start with liquid foods such as gelatin or soup. Progress to regular foods as tolerated. Avoid greasy, spicy, heavy foods. If nausea and/or vomiting occur, drink only clear liquids until the nausea and/or vomiting subsides. Call your physician if vomiting continues.  Special Instructions/Symptoms: Your throat may feel dry or sore from the anesthesia or the breathing tube placed in your throat during surgery. If this causes discomfort, gargle with warm salt water . The discomfort should disappear within 24  hours.  If you had a scopolamine  patch placed behind your ear for the management of post- operative nausea and/or vomiting:  1. The medication in the patch is effective for 72 hours, after which it should be removed.  Wrap patch in a tissue and discard in the trash. Wash hands thoroughly with soap and water . 2. You may remove the patch earlier than 72 hours if you experience unpleasant side effects which may include dry mouth, dizziness or visual disturbances. 3. Avoid touching the patch. Wash your hands with soap and water  after contact with the patch.  Call your surgeon if you experience:   1.  Fever over 101.0. 2.  Inability to urinate. 3.  Nausea and/or vomiting. 4.  Extreme swelling or bruising at the surgical site. 5.  Continued bleeding from the incision. 6.  Increased pain, redness or drainage from the incision. 7.  Problems related to your pain medication. 8. Any change in color, movement and/or sensation 9. Any problems and/or concerns

## 2024-03-24 NOTE — Anesthesia Preprocedure Evaluation (Signed)
 Anesthesia Evaluation  Patient identified by MRN, date of birth, ID band Patient awake    Reviewed: Allergy & Precautions, H&P , NPO status , Patient's Chart, lab work & pertinent test results  History of Anesthesia Complications (+) PONV and history of anesthetic complications  Airway Mallampati: I  TM Distance: >3 FB Neck ROM: Full    Dental  (+) Teeth Intact, Dental Advisory Given   Pulmonary neg pulmonary ROS   Pulmonary exam normal breath sounds clear to auscultation       Cardiovascular negative cardio ROS Normal cardiovascular exam Rhythm:Regular Rate:Normal     Neuro/Psych Hx pituitary microadenoma nonoperative  negative psych ROS   GI/Hepatic negative GI ROS, Neg liver ROS,,,  Endo/Other  negative endocrine ROS    Renal/GU negative Renal ROS  negative genitourinary   Musculoskeletal negative musculoskeletal ROS (+)    Abdominal   Peds negative pediatric ROS (+)  Hematology negative hematology ROS (+)   Anesthesia Other Findings   Reproductive/Obstetrics negative OB ROS                              Anesthesia Physical Anesthesia Plan  ASA: 1  Anesthesia Plan: General   Post-op Pain Management: Tylenol  PO (pre-op)* and Toradol  IV (intra-op)*   Induction: Intravenous  PONV Risk Score and Plan: 4 or greater and Propofol  infusion, TIVA, Midazolam , Treatment may vary due to age or medical condition, Ondansetron  and Dexamethasone   Airway Management Planned: LMA  Additional Equipment: None  Intra-op Plan:   Post-operative Plan: Extubation in OR  Informed Consent: I have reviewed the patients History and Physical, chart, labs and discussed the procedure including the risks, benefits and alternatives for the proposed anesthesia with the patient or authorized representative who has indicated his/her understanding and acceptance.     Dental advisory given  Plan  Discussed with: CRNA  Anesthesia Plan Comments:         Anesthesia Quick Evaluation

## 2024-03-24 NOTE — Anesthesia Postprocedure Evaluation (Signed)
 Anesthesia Post Note  Patient: Briana Valdez  Procedure(s) Performed: PERINEOPLASTY REVISION     Patient location during evaluation: PACU Anesthesia Type: General Level of consciousness: awake and alert, oriented and patient cooperative Pain management: pain level controlled Vital Signs Assessment: post-procedure vital signs reviewed and stable Respiratory status: spontaneous breathing, nonlabored ventilation and respiratory function stable Cardiovascular status: blood pressure returned to baseline and stable Postop Assessment: no apparent nausea or vomiting Anesthetic complications: no   No notable events documented.  Last Vitals:  Vitals:   03/24/24 0823 03/24/24 1120  BP: (!) 125/93 121/75  Pulse: 81 73  Resp: 16 11  Temp: 36.7 C 36.4 C  SpO2: 96% 100%    Last Pain:  Vitals:   03/24/24 1120  TempSrc:   PainSc: Asleep                 Briana Valdez

## 2024-03-24 NOTE — Op Note (Signed)
 Operative Note  Preoperative Diagnosis: perineal wound breakdown  Postoperative Diagnosis: same  Procedures performed:  Perineoplasty  Implants: none  Attending Surgeon: Rosaline Caper, MD  Anesthesia: General LMA  Findings: Enlarged genital hiatus with two areas of skin bridging between the posterior vaginal wall and perineum.   Specimens: none  Estimated blood loss: 25 mL  IV fluids: 800 mL  Urine output: 400 mL  Complications: none  Procedure in Detail:  After informed consent was obtained, the patient was taken to the operating room where general anesthesia was induced. She was placed in dorsal lithotomy position, taking care to avoid any traction of the extremities and prepped and draped in the usual sterile fashion.    Two allis clamps were placed at the introitus. The perineum was injected with 1% lidocaine  with epinephrine . A diamond shaped incision was made over the perineum and excess skin was removed. Dissection was performed with Metzenbaum scissors to separate the mucosa from the underlying tissue. The perineal body was then reapproximated with two interrupted 0-PDS sutures. The subcutaneous perineal tissue was closed with an interrupted 2-0 vicryl. The epithelium was then closed with a 2-0 monocryl inferiorly with subcutaneous suture and back up superiorly with a subcuticular stitch. Irrigation was performed and good hemostasis was noted. A red rubber was placed to drain the bladder.  A rectal examination was normal and confirmed no sutures within the rectum. The patient tolerated the procedure well.  She was awakened from anesthesia and transferred to the recovery room in stable condition. All counts were correct x 2.

## 2024-03-24 NOTE — Telephone Encounter (Signed)
 Briana Valdez underwent a perineoplasty on 03/24/24.   Briana Valdez was able to void prior to discharge.  Briana Valdez was discharged without a catheter. Please call her for a routine post op check. Thanks!  Briana LOISE Caper, MD

## 2024-03-24 NOTE — Transfer of Care (Signed)
 Immediate Anesthesia Transfer of Care Note  Patient: Briana Valdez  Procedure(s) Performed: PERINEOPLASTY REVISION  Patient Location: PACU  Anesthesia Type:General  Level of Consciousness: drowsy and patient cooperative  Airway & Oxygen Therapy: Patient Spontanous Breathing and Patient connected to face mask oxygen  Post-op Assessment: Report given to RN, Post -op Vital signs reviewed and stable, and Patient moving all extremities  Post vital signs: Reviewed and stable  Last Vitals:  Vitals Value Taken Time  BP 121/75 03/24/24 11:20  Temp 97.6   Pulse 86 03/24/24 11:24  Resp 15 03/24/24 11:24  SpO2 99 % 03/24/24 11:24  Vitals shown include unfiled device data.  Last Pain:  Vitals:   03/24/24 0823  TempSrc: Oral  PainSc: 0-No pain      Patients Stated Pain Goal: 5 (03/24/24 9176)  Complications: No notable events documented.

## 2024-03-24 NOTE — Anesthesia Procedure Notes (Addendum)
 Procedure Name: LMA Insertion Date/Time: 03/24/2024 10:28 AM  Performed by: Shlomo Tinnie SAILOR, RNPre-anesthesia Checklist: Patient identified, Emergency Drugs available, Suction available, Patient being monitored and Timeout performed Patient Re-evaluated:Patient Re-evaluated prior to induction Oxygen Delivery Method: Circle system utilized Preoxygenation: Pre-oxygenation with 100% oxygen Induction Type: IV induction Ventilation: Mask ventilation without difficulty LMA: LMA inserted LMA Size: 4.0 Number of attempts: 1 Placement Confirmation: breath sounds checked- equal and bilateral and positive ETCO2 Tube secured with: Tape Dental Injury: Teeth and Oropharynx as per pre-operative assessment  Comments: Placed by [facility]

## 2024-03-25 ENCOUNTER — Encounter (HOSPITAL_COMMUNITY): Payer: Self-pay | Admitting: Obstetrics and Gynecology

## 2024-03-25 ENCOUNTER — Encounter: Admitting: Obstetrics and Gynecology

## 2024-03-27 NOTE — Telephone Encounter (Signed)
 Called and spoke to patient: She reports her pain is a 2/10. Is not taking tylenol  or ibuprofen  Patient has been having a bowel movement every day.  Patient denies significant bleeding, states just a tinge on the pad.  Reports some itching in the perineal area.  Denies fevers.

## 2024-03-31 ENCOUNTER — Ambulatory Visit (INDEPENDENT_AMBULATORY_CARE_PROVIDER_SITE_OTHER): Admitting: Obstetrics and Gynecology

## 2024-03-31 ENCOUNTER — Encounter: Payer: Self-pay | Admitting: Obstetrics and Gynecology

## 2024-03-31 VITALS — BP 119/70 | HR 86

## 2024-03-31 DIAGNOSIS — Z9889 Other specified postprocedural states: Secondary | ICD-10-CM

## 2024-03-31 DIAGNOSIS — Z48816 Encounter for surgical aftercare following surgery on the genitourinary system: Secondary | ICD-10-CM

## 2024-03-31 NOTE — Progress Notes (Signed)
 [facility]  Date of Visit: 03/31/2024  History of Present Illness: [name] is a 43 y.o. female scheduled today for a post-operative visit.   Surgery: s/p revision perineoplasty on 03/24/24  She is doing well. Reports no issues. She has been taking ibuprofen  and tylenol  and does not have any significant pain. Returned to work last week. Has been cleaning perineum with water .   Medications: She has a current medication list which includes the following prescription(s): acetaminophen , cabergoline , docusate sodium, estradiol , ibuprofen , meclizine , oxycodone , phenylephrine , and polyethylene glycol powder.   Allergies: Patient has no known allergies.   Physical Exam: BP 119/70   Pulse 86    Perineum healing well. Incision is intact, no granulation tissue, no erythema or discharge.   ---------------------------------------------------------  Assessment and Plan:  1. Post-operative state     - Start estrace  cream on incision twice a week.  - Will recheck incision in a month. She will call with any concerns.   Briana LOISE Caper, MD

## 2024-04-09 ENCOUNTER — Telehealth: Admitting: Physician Assistant

## 2024-04-09 DIAGNOSIS — B9689 Other specified bacterial agents as the cause of diseases classified elsewhere: Secondary | ICD-10-CM | POA: Diagnosis not present

## 2024-04-09 DIAGNOSIS — J019 Acute sinusitis, unspecified: Secondary | ICD-10-CM | POA: Diagnosis not present

## 2024-04-09 MED ORDER — AMOXICILLIN-POT CLAVULANATE 875-125 MG PO TABS: 1.0000 | ORAL_TABLET | Freq: Two times a day (BID) | ORAL | 0 refills | Status: DC

## 2024-04-09 NOTE — Progress Notes (Signed)

## 2024-04-10 ENCOUNTER — Encounter: Payer: Self-pay | Admitting: Obstetrics and Gynecology

## 2024-04-14 ENCOUNTER — Ambulatory Visit (INDEPENDENT_AMBULATORY_CARE_PROVIDER_SITE_OTHER): Admitting: Obstetrics and Gynecology

## 2024-04-14 ENCOUNTER — Ambulatory Visit: Admitting: Urology

## 2024-04-14 VITALS — BP 119/79 | HR 72

## 2024-04-14 DIAGNOSIS — Z9889 Other specified postprocedural states: Secondary | ICD-10-CM

## 2024-04-14 DIAGNOSIS — Z48816 Encounter for surgical aftercare following surgery on the genitourinary system: Secondary | ICD-10-CM

## 2024-04-14 NOTE — Progress Notes (Unsigned)
 Briana Valdez  Date of Visit: 04/14/2024  History of Present Illness: Briana Valdez is a 43 y.o. female scheduled today for a post-operative visit.   Surgery: s/p revision perineoplasty on 03/24/24  She is doing well. Reports no issues. She has been taking ibuprofen  and tylenol  and does not have any significant pain. Returned to work last week. Has been cleaning perineum with water .   Medications: She has a current medication list which includes the following prescription(s): acetaminophen , amoxicillin -clavulanate, cabergoline , docusate sodium, estradiol , ibuprofen , and phenylephrine .   Allergies: Patient has no known allergies.   Physical Exam: BP 119/79   Pulse 72    Perineum healing well. Incision is intact, no granulation tissue, no erythema or discharge.   ---------------------------------------------------------  Assessment and Plan:  No diagnosis found.   - Start estrace  cream on incision twice a week.  - Will recheck incision in a month. She will call with any concerns.   Briana LOISE Caper, MD

## 2024-04-14 NOTE — Patient Instructions (Signed)
 Continue daily estrogen cream. Return 1 week for exam.

## 2024-04-15 ENCOUNTER — Encounter: Payer: Self-pay | Admitting: Obstetrics and Gynecology

## 2024-04-22 ENCOUNTER — Ambulatory Visit (INDEPENDENT_AMBULATORY_CARE_PROVIDER_SITE_OTHER): Admitting: Obstetrics and Gynecology

## 2024-04-22 ENCOUNTER — Encounter: Payer: Self-pay | Admitting: Obstetrics and Gynecology

## 2024-04-22 VITALS — BP 123/86 | HR 81

## 2024-04-22 DIAGNOSIS — Z9889 Other specified postprocedural states: Secondary | ICD-10-CM

## 2024-04-22 DIAGNOSIS — Z48816 Encounter for surgical aftercare following surgery on the genitourinary system: Secondary | ICD-10-CM

## 2024-04-22 NOTE — Progress Notes (Signed)
  [facility]  Date of Visit: 04/22/2024  History of Present Illness: Ms. Macdonnell is a 43 y.o. female scheduled today for a post-operative visit.   Surgery: s/p revision perineoplasty on 03/24/24  Soreness has improved since last visit.  Has been using estrace  cream nightly.   Medications: She has a current medication list which includes the following prescription(s): acetaminophen , amoxicillin -clavulanate, cabergoline , docusate sodium, estradiol , ibuprofen , and phenylephrine .   Allergies: Patient has no known allergies.   Physical Exam: BP 123/86   Pulse 81    At introitus, small separation of mucosa with good ingrowth of tissue below. Silver nitrate applied.   ---------------------------------------------------------  Assessment and Plan:  1. Post-operative state     - Continue estrace  cream every other day.  - Will reexamine in a week   Rosaline LOISE Caper, MD

## 2024-04-28 DIAGNOSIS — D225 Melanocytic nevi of trunk: Secondary | ICD-10-CM | POA: Diagnosis not present

## 2024-04-28 DIAGNOSIS — L820 Inflamed seborrheic keratosis: Secondary | ICD-10-CM | POA: Diagnosis not present

## 2024-04-28 DIAGNOSIS — Z1283 Encounter for screening for malignant neoplasm of skin: Secondary | ICD-10-CM | POA: Diagnosis not present

## 2024-04-28 DIAGNOSIS — L821 Other seborrheic keratosis: Secondary | ICD-10-CM | POA: Diagnosis not present

## 2024-05-01 ENCOUNTER — Ambulatory Visit (INDEPENDENT_AMBULATORY_CARE_PROVIDER_SITE_OTHER): Admitting: Obstetrics and Gynecology

## 2024-05-01 ENCOUNTER — Other Ambulatory Visit: Payer: Self-pay | Admitting: Obstetrics and Gynecology

## 2024-05-01 ENCOUNTER — Encounter: Payer: Self-pay | Admitting: Obstetrics and Gynecology

## 2024-05-01 VITALS — BP 133/82 | HR 88

## 2024-05-01 DIAGNOSIS — Z9889 Other specified postprocedural states: Secondary | ICD-10-CM

## 2024-05-01 DIAGNOSIS — Z48816 Encounter for surgical aftercare following surgery on the genitourinary system: Secondary | ICD-10-CM

## 2024-05-01 NOTE — Progress Notes (Signed)
 [facility]  Date of Visit: 05/01/2024  History of Present Illness: [name] is a 43 y.o. female scheduled today for a post-operative visit.   Surgery: s/p revision perineoplasty on 03/24/24  Tenderness has improved. Has been using estrace  cream every other day.   Medications: She has a current medication list which includes the following prescription(s): acetaminophen , amoxicillin -clavulanate, cabergoline , docusate sodium, estradiol , ibuprofen , and phenylephrine .   Allergies: Patient has no known allergies.   Physical Exam: BP 133/82   Pulse 88    At introitus, small separation of mucosa with good ingrowth of tissue below.   ---------------------------------------------------------  Assessment and Plan:  1. Post-operative state      - Continue estrace  cream every other day. Use aquaphor on other nights.  - Reexamine in 3 weeks   Rosaline LOISE Caper, MD

## 2024-05-05 DIAGNOSIS — M26629 Arthralgia of temporomandibular joint, unspecified side: Secondary | ICD-10-CM | POA: Diagnosis not present

## 2024-05-05 DIAGNOSIS — H6122 Impacted cerumen, left ear: Secondary | ICD-10-CM | POA: Diagnosis not present

## 2024-05-21 ENCOUNTER — Encounter: Payer: Self-pay | Admitting: Obstetrics and Gynecology

## 2024-05-21 ENCOUNTER — Ambulatory Visit (INDEPENDENT_AMBULATORY_CARE_PROVIDER_SITE_OTHER): Admitting: Obstetrics and Gynecology

## 2024-05-21 VITALS — BP 138/85 | HR 97

## 2024-05-21 DIAGNOSIS — Z48816 Encounter for surgical aftercare following surgery on the genitourinary system: Secondary | ICD-10-CM

## 2024-05-21 DIAGNOSIS — Z9889 Other specified postprocedural states: Secondary | ICD-10-CM

## 2024-05-21 MED ORDER — LIDOCAINE-EPINEPHRINE 1 %-1:100000 IJ SOLN
3.0000 mL | Freq: Once | INTRAMUSCULAR | Status: AC
  Administered 2024-05-21: 3 mL

## 2024-05-21 NOTE — Progress Notes (Signed)
 [facility]  Date of Visit: 05/21/2024  History of Present Illness: Ms. Denison is a 43 y.o. female scheduled today for a post-operative visit.   Surgery: s/p revision perineoplasty on 03/24/24  Has noticed improvement in healing. Able to wear jeans without irritaition. Has been using estrace  cream every other day and aquaphor on other days. Has not noticed any bleeding.   Initial surgery: Total vaginal hysterectomy with bilateral salpingectomy, uterosacral ligament suspension, anterior and posterior repair with perineorrhaphy, cystoscopy, urethral bulking (Bulkamid) on 09/24/23   Medications: She has a current medication list which includes the following prescription(s): acetaminophen , amoxicillin -clavulanate, cabergoline , docusate sodium, estradiol , ibuprofen , and phenylephrine .   Allergies: Patient has no known allergies.   Physical Exam: BP 138/85   Pulse 97    At introitus, 0.5 cm separation still present. Silver nitrate was applied. Area prepped with betadine  and hurricane jelly. 3cc of 1% lidocaine  with epi was injected. Then a 3-0 interrupted vicryl suture was placed. This tore out with tying down and was replaced. An additional interrupted suture was placed where the tissue was torn and hemostasis was achieved.   ---------------------------------------------------------  Assessment and Plan:  1. Post-operative state      - Continue estrace  cream every other day. Use aquaphor on other nights.  - Reexamine in 2 weeks   Rosaline LOISE Caper, MD

## 2024-05-28 ENCOUNTER — Telehealth: Payer: Self-pay

## 2024-05-28 NOTE — Telephone Encounter (Signed)
 Briana Valdez is a 43 y.o. female calls today with concerns of a possible vaginal infection. She is only concerned due to the fact she had a stich placed last week. Her main complaint is itching. She will be town tomorrow and would like to know if you would like her to take a look, or have her do a self swab vs antibiotic treatment.

## 2024-05-28 NOTE — Telephone Encounter (Signed)
 Patient has been scheduled for 800am on 05-29-2024

## 2024-05-28 NOTE — Telephone Encounter (Signed)
 Patient left a voice mail stating she would like to be seen if possible tomorrow since she will be in [place]. Patient states she had a stitch put in last week and is not certain if she could have an infection.  Please advice

## 2024-05-29 ENCOUNTER — Ambulatory Visit (INDEPENDENT_AMBULATORY_CARE_PROVIDER_SITE_OTHER): Admitting: Obstetrics and Gynecology

## 2024-05-29 ENCOUNTER — Encounter: Payer: Self-pay | Admitting: Obstetrics and Gynecology

## 2024-05-29 ENCOUNTER — Other Ambulatory Visit (HOSPITAL_COMMUNITY)
Admission: RE | Admit: 2024-05-29 | Discharge: 2024-05-29 | Disposition: A | Source: Ambulatory Visit | Attending: Obstetrics and Gynecology | Admitting: Obstetrics and Gynecology

## 2024-05-29 VITALS — BP 127/88 | HR 81

## 2024-05-29 DIAGNOSIS — N898 Other specified noninflammatory disorders of vagina: Secondary | ICD-10-CM

## 2024-05-29 DIAGNOSIS — Z48816 Encounter for surgical aftercare following surgery on the genitourinary system: Secondary | ICD-10-CM

## 2024-05-29 DIAGNOSIS — R350 Frequency of micturition: Secondary | ICD-10-CM

## 2024-05-29 NOTE — Progress Notes (Signed)
 [facility]  Date of Visit: 05/29/2024  History of Present Illness: Briana Valdez is a 43 y.o. female scheduled today for a post-operative visit.   Surgery: s/p revision perineoplasty on 03/24/24  Has noticed a little irritation and discharge. Not sure if she has an infection. Has been using vaginal estrogen.   Initial surgery: Total vaginal hysterectomy with bilateral salpingectomy, uterosacral ligament suspension, anterior and posterior repair with perineorrhaphy, cystoscopy, urethral bulking (Bulkamid) on 09/24/23   Medications: She has a current medication list which includes the following prescription(s): acetaminophen , amoxicillin -clavulanate, cabergoline , docusate sodium, estradiol , ibuprofen , and phenylephrine .   Allergies: Patient has no known allergies.   Physical Exam: BP 127/88   Pulse 81    At introitus, prior suture has pulled through with small persistent gap at introitus. Other suture in place. Silver nitrate applied to edges. No significant discharge noted but aptima obtained.   ---------------------------------------------------------  Assessment and Plan:  1. Urinary frequency   2. Vaginal irritation    - Aptima swab sent to r/o infection - Continue estrace  cream every other day. Use aquaphor on other nights.  - Reexamine in 2 weeks   Rosaline LOISE Caper, MD

## 2024-06-02 LAB — CERVICOVAGINAL ANCILLARY ONLY
Bacterial Vaginitis (gardnerella): POSITIVE — AB
Candida Glabrata: NEGATIVE
Candida Vaginitis: NEGATIVE
Comment: NEGATIVE
Comment: NEGATIVE
Comment: NEGATIVE

## 2024-06-03 ENCOUNTER — Ambulatory Visit: Payer: Self-pay | Admitting: Obstetrics and Gynecology

## 2024-06-03 DIAGNOSIS — B9689 Other specified bacterial agents as the cause of diseases classified elsewhere: Secondary | ICD-10-CM

## 2024-06-03 MED ORDER — METRONIDAZOLE 500 MG PO TABS: 500.0000 mg | ORAL_TABLET | Freq: Two times a day (BID) | ORAL | 0 refills | Status: AC

## 2024-06-08 ENCOUNTER — Encounter: Payer: Self-pay | Admitting: Obstetrics and Gynecology

## 2024-06-11 ENCOUNTER — Encounter: Payer: Self-pay | Admitting: Obstetrics and Gynecology

## 2024-06-11 ENCOUNTER — Ambulatory Visit (INDEPENDENT_AMBULATORY_CARE_PROVIDER_SITE_OTHER): Admitting: Obstetrics and Gynecology

## 2024-06-11 VITALS — BP 134/83 | HR 78

## 2024-06-11 DIAGNOSIS — K648 Other hemorrhoids: Secondary | ICD-10-CM | POA: Diagnosis not present

## 2024-06-11 DIAGNOSIS — Z9889 Other specified postprocedural states: Secondary | ICD-10-CM

## 2024-06-11 DIAGNOSIS — Z48816 Encounter for surgical aftercare following surgery on the genitourinary system: Secondary | ICD-10-CM

## 2024-06-11 MED ORDER — HYDROCORTISONE ACETATE 25 MG RE SUPP: 25.0000 mg | Freq: Two times a day (BID) | RECTAL | 0 refills | Status: AC

## 2024-06-11 NOTE — Progress Notes (Signed)
 Briana Valdez  Date of Visit: 06/11/2024  History of Present Illness: [name] is a 43 y.o. female scheduled today for a post-operative visit.   Surgery: s/p revision perineoplasty on 03/24/24  Perineal irritation has improved. She has been using her estradiol  cream every other day. She has been bothered by her hemorrhoids recently. Has been using Recticare cream  Initial surgery: Total vaginal hysterectomy with bilateral salpingectomy, uterosacral ligament suspension, anterior and posterior repair with perineorrhaphy, cystoscopy, urethral bulking (Bulkamid) on 09/24/23   Medications: She has a current medication list which includes the following prescription(s): acetaminophen , amoxicillin -clavulanate, cabergoline , docusate sodium, estradiol , hydrocortisone, ibuprofen , and phenylephrine .   Allergies: Patient has no known allergies.   Physical Exam: BP 134/83   Pulse 78    At introitus, healing well. Suture still present with small amount of granulation tissue on the left, treated with silver nitrate.   Hemmorhoid noted at anus, no thrombosis present  ---------------------------------------------------------  Assessment and Plan:  1. Post-operative state   2. Other hemorrhoids    - Continue estrace  cream every other day. Use aquaphor on other nights.  - Reexamine in 2 weeks - Will send anusol suppositories for hemorrhoids. She has a GI that she will follow up with for management.    Briana LOISE Caper, MD

## 2024-06-13 ENCOUNTER — Other Ambulatory Visit: Payer: Self-pay

## 2024-06-13 DIAGNOSIS — N200 Calculus of kidney: Secondary | ICD-10-CM

## 2024-06-16 ENCOUNTER — Ambulatory Visit (HOSPITAL_COMMUNITY)
Admission: RE | Admit: 2024-06-16 | Discharge: 2024-06-16 | Disposition: A | Discharge status: Home / Self Care | Source: Ambulatory Visit | Attending: Urology | Admitting: Urology

## 2024-06-16 ENCOUNTER — Ambulatory Visit (INDEPENDENT_AMBULATORY_CARE_PROVIDER_SITE_OTHER): Admitting: Urology

## 2024-06-16 VITALS — BP 122/71 | HR 75

## 2024-06-16 DIAGNOSIS — Z87442 Personal history of urinary calculi: Secondary | ICD-10-CM | POA: Diagnosis not present

## 2024-06-16 DIAGNOSIS — N200 Calculus of kidney: Secondary | ICD-10-CM | POA: Insufficient documentation

## 2024-06-16 DIAGNOSIS — I878 Other specified disorders of veins: Secondary | ICD-10-CM | POA: Diagnosis not present

## 2024-06-16 LAB — URINALYSIS, ROUTINE W REFLEX MICROSCOPIC
Bilirubin, UA: NEGATIVE
Glucose, UA: NEGATIVE
Ketones, UA: NEGATIVE
Leukocytes,UA: NEGATIVE
Nitrite, UA: NEGATIVE
Protein,UA: NEGATIVE
RBC, UA: NEGATIVE
Specific Gravity, UA: 1.01 (ref 1.005–1.030)
Urobilinogen, Ur: 0.2 mg/dL (ref 0.2–1.0)
pH, UA: 7 (ref 5.0–7.5)

## 2024-06-16 NOTE — Progress Notes (Unsigned)
 06/16/2024 10:06 AM   Briana Valdez 12-12-1980 [id number]  Referring provider: Alphonsa Glendia LABOR, MD [address] [place],  [place] [address]  nephrolithiasis   HPI: Briana Valdez is a 43yo here for followup for nephrolithiasis. She denies any stone events since last visit. KUB does not show any definitive calculi. She has known bilateral 1-82mm calculi. She denies nay significant LUTS. She drinks over 64oz of water  daily. No family history of nephrolithiasis   PMH: Past Medical History:  Diagnosis Date   Hemorrhoids    History of kidney stones    Pituitary microadenoma with hyperprolactinemia [facility]) 2018   endocrinologist--- dr sam; dx 2018;     last MRI 02/ 2024 in epic stable adenoma;  prolactin level normal while taking cabergoline    PONV (postoperative nausea and vomiting)    SUI (stress urinary incontinence, female)    Uterovaginal prolapse, incomplete     Surgical History: Past Surgical History:  Procedure Laterality Date   ANTERIOR AND POSTERIOR REPAIR N/A 09/24/2023   Procedure: ANTERIOR (CYSTOCELE) AND POSTERIOR REPAIR (RECTOCELE) with perineorrhaphy;  Surgeon: Marilynne Rosaline SAILOR, MD;  Location: [facility];  Service: Gynecology;  Laterality: N/A;   BREAST ENHANCEMENT SURGERY Bilateral 2010   w/ implants   COLONOSCOPY WITH PROPOFOL  N/A 10/18/2020   by dr cindie ;   Non-bleeding internal hemorrhoids, otherwise normal.   CYSTOSCOPY N/A 09/24/2023   Procedure: CYSTOSCOPY;  Surgeon: Marilynne Rosaline SAILOR, MD;  Location: [facility];  Service: Gynecology;  Laterality: N/A;   PERINEOPLASTY N/A 03/24/2024   Procedure: PERINEOPLASTY REVISION;  Surgeon: Marilynne Rosaline SAILOR, MD;  Location: [facility];  Service: Gynecology;  Laterality: N/A;   VAGINAL HYSTERECTOMY N/A 09/24/2023   Procedure: HYSTERECTOMY VAGINAL WITH BILATERAL SALPINGECTOMY;  Surgeon: Marilynne Rosaline SAILOR, MD;  Location: [facility];  Service: Gynecology;   Laterality: N/A;   VAGINAL PROLAPSE REPAIR N/A 09/24/2023   Procedure: UTEROSACRAL LIGAMENT SUSPENSION;  Surgeon: Marilynne Rosaline SAILOR, MD;  Location: [facility];  Service: Gynecology;  Laterality: N/A;    Home Medications:  Allergies as of 06/16/2024   No Known Allergies      Medication List        Accurate as of June 16, 2024 10:06 AM. If you have any questions, ask your nurse or doctor.          Acetaminophen  Extra Strength 500 MG Tabs Take 1 tablet (500 mg total) by mouth every 6 (six) hours as needed (pain).   amoxicillin -clavulanate 875-125 MG tablet Commonly known as: AUGMENTIN  Take 1 tablet by mouth 2 (two) times daily.   cabergoline  0.5 MG tablet Commonly known as: DOSTINEX  Take 0.5 tablets (0.25 mg total) by mouth once a week.   docusate sodium 50 MG capsule Commonly known as: COLACE Take 50 mg by mouth daily.   estradiol  0.1 MG/GM vaginal cream Commonly known as: ESTRACE  PLACE 0.5 GRAMS VAGINALLY EVERY EVENING FOR 2 WEEKS, THEN TWICE A WEEK THEREAFTER.   hydrocortisone 25 MG suppository Commonly known as: ANUSOL-HC Place 1 suppository (25 mg total) rectally 2 (two) times daily.   ibuprofen  600 MG tablet Commonly known as: ADVIL  Take 1 tablet (600 mg total) by mouth every 6 (six) hours as needed.   phenylephrine  10 MG Tabs tablet Commonly known as: SUDAFED PE Take 10 mg by mouth every 4 (four) hours as needed.        Allergies: No Known Allergies  Family History: Family History  Problem Relation Age  of Onset   Breast cancer Mother    Cancer Mother        breast   Diabetes Father    Hypertension Father    Liver disease Father    Breast cancer Maternal Grandmother    Breast cancer Paternal Grandmother    Colon cancer Paternal Grandmother    Colon polyps Neg Hx    Other Neg Hx     Social History:  reports that she has never smoked. She has never used smokeless tobacco. She reports that she does not drink alcohol and  does not use drugs.  ROS: All other review of systems were reviewed and are negative except what is noted above in HPI  Physical Exam: BP 122/71   Pulse 75   Constitutional:  Alert and oriented, No acute distress. HEENT: [place] AT, moist mucus membranes.  Trachea midline, no masses. Cardiovascular: No clubbing, cyanosis, or edema. Respiratory: Normal respiratory effort, no increased work of breathing. GI: Abdomen is soft, nontender, nondistended, no abdominal masses GU: No CVA tenderness.  Lymph: No cervical or inguinal lymphadenopathy. Skin: No rashes, bruises or suspicious lesions. Neurologic: Grossly intact, no focal deficits, moving all 4 extremities. Psychiatric: Normal mood and affect.  Laboratory Data: Lab Results  Component Value Date   WBC 7.4 11/22/2023   HGB 15.0 11/22/2023   HCT 45.4 11/22/2023   MCV 97.0 11/22/2023   PLT 255 11/22/2023    Lab Results  Component Value Date   CREATININE 0.59 11/22/2023    No results found for: PSA  No results found for: TESTOSTERONE  No results found for: HGBA1C  Urinalysis    Component Value Date/Time   COLORURINE YELLOW 09/01/2023 2050   APPEARANCEUR Clear 09/13/2023 1323   LABSPEC 1.017 09/01/2023 2050   PHURINE 5.0 09/01/2023 2050   GLUCOSEU Negative 09/13/2023 1323   HGBUR SMALL (A) 09/01/2023 2050   BILIRUBINUR Negative 09/13/2023 1323   KETONESUR 20 (A) 09/01/2023 2050   PROTEINUR Negative 09/13/2023 1323   PROTEINUR NEGATIVE 09/01/2023 2050   UROBILINOGEN 0.2 05/16/2023 1619   NITRITE Negative 09/13/2023 1323   NITRITE NEGATIVE 09/01/2023 2050   LEUKOCYTESUR Negative 09/13/2023 1323   LEUKOCYTESUR NEGATIVE 09/01/2023 2050    Lab Results  Component Value Date   LABMICR Comment 09/13/2023   BACTERIA RARE (A) 09/01/2023    Pertinent Imaging: KUb today: Images reviewed and discussed with the patient  Results for orders placed during the hospital encounter of 09/13/23  DG Abd 1  View  Narrative CLINICAL DATA:  Left ureter stone.  EXAM: ABDOMEN - 1 VIEW  COMPARISON:  09/06/2023 and CT 09/01/2023  FINDINGS: No definite stones overlying the renal shadows but limited evaluation due to bowel gas. Again noted are small pelvic calcifications. There were 2 small calcifications on left side of the pelvis on 09/06/2023 but now only 1 calcification in this area. Again noted is in the IUD in the pelvis. Nonobstructive bowel gas pattern.  IMPRESSION: 1. Left ureter stone may have passed. There is 1 less calcification on the left side of the pelvis compared to the exam on 09/06/2023.   Electronically Signed By: Juliene Balder M.D. On: 09/13/2023 13:39  No results found for this or any previous visit.  No results found for this or any previous visit.  No results found for this or any previous visit.  No results found for this or any previous visit.  No results found for this or any previous visit.  No results found for this or  any previous visit.  Results for orders placed during the hospital encounter of 09/01/23  CT Renal Stone Study  Narrative CLINICAL DATA:  Left flank pain x6 days.  EXAM: CT ABDOMEN AND PELVIS WITHOUT CONTRAST  TECHNIQUE: Multidetector CT imaging of the abdomen and pelvis was performed following the standard protocol without IV contrast.  RADIATION DOSE REDUCTION: This exam was performed according to the departmental dose-optimization program which includes automated exposure control, adjustment of the mA and/or kV according to patient size and/or use of iterative reconstruction technique.  COMPARISON:  None Available.  FINDINGS: Lower chest: A right breast implant is noted.  No acute abnormality is identified.  Hepatobiliary: Very small foci of parenchymal low attenuation are seen within the right and left lobes of the liver. The largest is seen within the anteromedial aspect of the left lobe and measures 7 mm (axial CT  image 20, CT series 2). No gallstones, gallbladder wall thickening, or biliary dilatation.  Pancreas: Unremarkable. No pancreatic ductal dilatation or surrounding inflammatory changes.  Spleen: Normal in size without focal abnormality.  Adrenals/Urinary Tract: Adrenal glands are unremarkable. Kidneys are normal in size without focal lesions. A 3 mm obstructing renal calculus is seen within the proximal to mid left ureter, with mild left-sided hydronephrosis and hydroureter. Several punctate nonobstructing renal calculi are seen within the mid and lower left kidney. The urinary bladder is poorly distended and subsequently limited in evaluation.  Stomach/Bowel: Stomach is within normal limits. Appendix appears normal. No evidence of bowel wall thickening, distention, or inflammatory changes.  Vascular/Lymphatic: Mild aortic atherosclerosis. No enlarged abdominal or pelvic lymph nodes.  Reproductive: The uterus is retroverted in position and contains a probably positioned IUD. The bilateral adnexa are unremarkable.  Other: No abdominal wall hernia or abnormality. No abdominopelvic ascites.  Musculoskeletal: No acute or significant osseous findings.  IMPRESSION: 1. 3 mm obstructing renal calculus within the proximal to mid left ureter. 2. Several punctate nonobstructing left renal calculi. 3. Retroverted uterus with a probably positioned IUD. 4. Aortic atherosclerosis.  Aortic Atherosclerosis (ICD10-I70.0).   Electronically Signed By: Suzen Dials M.D. On: 09/01/2023 21:41   Assessment & Plan:    1. Kidney stones (Primary) Metabolic evaluation  Followup 6 months with renal US  - Urinalysis, Routine w reflex microscopic   No follow-ups on file.  Belvie Clara, MD  [facility] [place]

## 2024-06-17 ENCOUNTER — Encounter: Payer: Self-pay | Admitting: Urology

## 2024-06-17 NOTE — Patient Instructions (Signed)

## 2024-06-18 ENCOUNTER — Ambulatory Visit: Payer: Self-pay | Admitting: Urology

## 2024-06-18 LAB — BASIC METABOLIC PANEL WITH GFR
BUN/Creatinine Ratio: 21 (ref 9–23)
BUN: 12 mg/dL (ref 6–24)
CO2: 24 mmol/L (ref 20–29)
Calcium: 9.5 mg/dL (ref 8.7–10.2)
Chloride: 100 mmol/L (ref 96–106)
Creatinine, Ser: 0.58 mg/dL (ref 0.57–1.00)
Glucose: 68 mg/dL — ABNORMAL LOW (ref 70–99)
Potassium: 4.6 mmol/L (ref 3.5–5.2)
Sodium: 140 mmol/L (ref 134–144)
eGFR: 114 mL/min/1.73 (ref >59)

## 2024-06-18 LAB — URIC ACID: Uric Acid: 4.2 mg/dL (ref 2.6–6.2)

## 2024-06-18 LAB — PTH, INTACT AND CALCIUM: PTH: 32 pg/mL (ref 15–65)

## 2024-06-27 ENCOUNTER — Ambulatory Visit (INDEPENDENT_AMBULATORY_CARE_PROVIDER_SITE_OTHER): Admitting: Obstetrics and Gynecology

## 2024-06-27 ENCOUNTER — Encounter: Payer: Self-pay | Admitting: Obstetrics and Gynecology

## 2024-06-27 VITALS — BP 123/83 | HR 77

## 2024-06-27 DIAGNOSIS — T8189XD Other complications of procedures, not elsewhere classified, subsequent encounter: Secondary | ICD-10-CM | POA: Diagnosis not present

## 2024-06-27 NOTE — Progress Notes (Signed)
 [facility]  Date of Visit: 06/27/2024  History of Present Illness: Briana Valdez is a 44 y.o. female scheduled today for a post-operative visit.   Surgery: s/p revision perineoplasty on 03/24/24  Has noticed a little soreness at the perineum about halfway through her workday. Has not seen any bleeding.   Initial surgery: Total vaginal hysterectomy with bilateral salpingectomy, uterosacral ligament suspension, anterior and posterior repair with perineorrhaphy, cystoscopy, urethral bulking (Bulkamid) on 09/24/23   Medications: She has a current medication list which includes the following prescription(s): acetaminophen , amoxicillin -clavulanate, cabergoline , docusate sodium, estradiol , hydrocortisone, ibuprofen , and phenylephrine .   Allergies: Patient has no known allergies.   Physical Exam: BP 123/83   Pulse 77    At introitus, healing well. Suture still present. No granulation tissue present.    ---------------------------------------------------------  Assessment and Plan:  1. Delayed surgical wound healing, subsequent encounter     - Continue estrace  cream every other day. Use aquaphor on other nights.  - Reexamine in 1 month    Briana LOISE Caper, MD  Time spent: I spent 10 minutes dedicated to the care of this patient on the date of this encounter to include pre-visit review of records, face-to-face time with the patient and post visit documentation.

## 2024-07-02 ENCOUNTER — Encounter: Admitting: Obstetrics and Gynecology

## 2024-07-07 ENCOUNTER — Other Ambulatory Visit: Payer: Self-pay | Admitting: Urology

## 2024-07-10 ENCOUNTER — Encounter: Admitting: Obstetrics and Gynecology

## 2024-07-17 LAB — LITHOLINK 24HR URINE PANEL
Ammonium, Urine: 30 mmol/(24.h) (ref 15–60)
Calcium Oxalate Saturation: 2.28 — ABNORMAL LOW (ref 6.00–10.00)
Calcium Phosphate Saturation: 1.38 (ref 0.50–2.00)
Calcium, Urine: 172 mg/(24.h) (ref <200)
Calcium/Creatinine Ratio: 185 mg/g{creat} (ref 51–262)
Calcium/Kg Body Weight: 3.1 mg/kg/d (ref <4.0)
Chloride, Urine: 179 mmol/(24.h) (ref 70–250)
Citrate, Urine: 524 mg/(24.h) — ABNORMAL LOW (ref >550)
Creatinine, Urine: 931 mg/(24.h)
Creatinine/Kg Body Weight: 16.8 mg/kg/d (ref 8.7–20.3)
Cystine, Urine, Qualitative: NEGATIVE
Magnesium, Urine: 48 mg/(24.h) (ref 30–120)
Oxalate, Urine: 17 mg/(24.h) — ABNORMAL LOW (ref 20–40)
Phosphorus, Urine: 757 mg/(24.h) (ref 600–1200)
Potassium, Urine: 64 mmol/(24.h) (ref 20–100)
Protein Catabolic Rate: 1 g/kg/d (ref 0.8–1.4)
Sodium, Urine: 177 mmol/(24.h) — ABNORMAL HIGH (ref 50–150)
Sulfate, Urine: 25 meq/(24.h) (ref 20–80)
Urea Nitrogen, Urine: 7.53 g/(24.h) (ref 6.00–14.00)
Uric Acid Saturation: 0.09 (ref <1.00)
Uric Acid, Urine: 637 mg/(24.h) (ref <750)
Urine Volume (Preserved): 2380 mL/(24.h) (ref 500–4000)
pH, 24 hr, Urine: 6.855 — ABNORMAL HIGH (ref 5.800–6.200)

## 2024-07-17 LAB — SPECIMEN STATUS REPORT

## 2024-07-21 DIAGNOSIS — N83202 Unspecified ovarian cyst, left side: Secondary | ICD-10-CM | POA: Diagnosis not present

## 2024-07-22 ENCOUNTER — Ambulatory Visit: Admitting: Gastroenterology

## 2024-07-22 VITALS — BP 134/80 | HR 86 | Temp 97.9°F | Ht 61.0 in | Wt 123.0 lb

## 2024-07-22 DIAGNOSIS — K642 Third degree hemorrhoids: Secondary | ICD-10-CM | POA: Insufficient documentation

## 2024-07-22 DIAGNOSIS — K641 Second degree hemorrhoids: Secondary | ICD-10-CM

## 2024-07-22 MED ORDER — HYDROCORTISONE (PERIANAL) 2.5 % EX CREA: 1.0000 | TOPICAL_CREAM | Freq: Two times a day (BID) | CUTANEOUS | 1 refills | Status: AC

## 2024-07-22 NOTE — Patient Instructions (Signed)
 I have sent in Anusol  cream to use twice a day, inserting pea-sized amount into rectum. Let me know if this is not tolerated!  We will see you back for hemorrhoid banding in the near future!  Have a great time at [facility]!   I enjoyed seeing you again today! I value our relationship and want to provide genuine, compassionate, and quality care. You may receive a survey regarding your visit with me, and I welcome your feedback! Thanks so much for taking the time to complete this. I look forward to seeing you again.      Therisa MICAEL Stager, PhD, ANP-BC [facility]

## 2024-07-22 NOTE — Progress Notes (Signed)
 Gastroenterology Office Note     Primary Care Physician:  Alphonsa Glendia LABOR, MD  Primary Gastroenterologist: Dr Cindie   Chief Complaint   Chief Complaint  Patient presents with   Follow-up    Follow up hemorrhoids. Pt states she isn't sure she could manage at one point but now she can't     History of Present Illness   Briana Valdez is a 43 y.o. female presenting today with a history of constipation, rectal bleeding due to internal hemorrhoids with colonoscopy in 2022 noting internal hemorrhoids, otherwise normal. She was last seen in March 2025 by Dr. Cindie. MRI liver due to liver cysts completed in interim with benign findings. Long-standing hx of hemorrhoids. Here today with her husband to discuss hemorrhoid banding.   Bladder and uterine prolapse earlier this year. Hysterectomy. Feels like things are worsened with her hemorrhoids. Went back Mar 24, 2024 as had delayed wound healing after labioplasty. Sees Dr. Marilynne frequently.   Tried suppositories recently prescribed by GYN and had blurred vision. Took 6-7 of these BID. This helped slightly.   Feels anusol  has been helpful in the past. Does not have any of this curently.   Recticare advanced has helped in the past. Tried witch hazel, tucks, no improvement.   Some throbbing today. BM twice today. Usually BM once per day. If more than once per day will have flare of hemorrhoids. 2 stool softeners at night before bed.   Very small tinge wisp of blood when did sitz bath. No significant rectal bleeding. Using baby wipes.   No rectal pain. No knife like pain. Feels prolapsing, fullness after having a BM. Sits on ice pack in morning while getting ready for work. Some reduce on own, some do not. Can feel pressure at end of day.   No straining, no constipation. Goes every morning, drinks coffee, walks around the house and then has a BM. Soft BM. No prolonged sitting.     March 2025 MRI liver IMPRESSION: Several tiny  cystic foci in the liver scattered diffusely and with benign features. Favor etiology such as multiple cysts or biliary hamartomas. These are less than a cm.   Incidental note made of a cystic lesion in the pelvis, likely ovarian. Please correlate with any known history or dedicated pelvic ultrasound when appropriate.   Colonoscopy 2022: internal hemorrhoids  Past Medical History:  Diagnosis Date   Hemorrhoids    History of kidney stones    Pituitary microadenoma with hyperprolactinemia [facility]) 2018   endocrinologist--- dr sam; dx 2018;     last MRI 02/ 2024 in epic stable adenoma;  prolactin level normal while taking cabergoline    PONV (postoperative nausea and vomiting)    SUI (stress urinary incontinence, female)    Uterovaginal prolapse, incomplete     Past Surgical History:  Procedure Laterality Date   ANTERIOR AND POSTERIOR REPAIR N/A 09/24/2023   Procedure: ANTERIOR (CYSTOCELE) AND POSTERIOR REPAIR (RECTOCELE) with perineorrhaphy;  Surgeon: Marilynne Rosaline SAILOR, MD;  Location: [facility];  Service: Gynecology;  Laterality: N/A;   BREAST ENHANCEMENT SURGERY Bilateral 2010   w/ implants   COLONOSCOPY WITH PROPOFOL  N/A 10/18/2020   by dr cindie ;   Non-bleeding internal hemorrhoids, otherwise normal.   CYSTOSCOPY N/A 09/24/2023   Procedure: CYSTOSCOPY;  Surgeon: Marilynne Rosaline SAILOR, MD;  Location: [facility];  Service: Gynecology;  Laterality: N/A;   PERINEOPLASTY N/A 03/24/2024   Procedure: PERINEOPLASTY REVISION;  Surgeon: Marilynne Rosaline SAILOR, MD;  Location: [facility] OR;  Service: Gynecology;  Laterality: N/A;   VAGINAL HYSTERECTOMY N/A 09/24/2023   Procedure: HYSTERECTOMY VAGINAL WITH BILATERAL SALPINGECTOMY;  Surgeon: Marilynne Rosaline SAILOR, MD;  Location: [facility];  Service: Gynecology;  Laterality: N/A;   VAGINAL PROLAPSE REPAIR N/A 09/24/2023   Procedure: UTEROSACRAL LIGAMENT SUSPENSION;  Surgeon: Marilynne Rosaline SAILOR, MD;   Location: [facility];  Service: Gynecology;  Laterality: N/A;    Current Outpatient Medications  Medication Sig Dispense Refill   acetaminophen  (TYLENOL ) 500 MG tablet Take 1 tablet (500 mg total) by mouth every 6 (six) hours as needed (pain). 30 tablet 0   cabergoline  (DOSTINEX ) 0.5 MG tablet Take 0.5 tablets (0.25 mg total) by mouth once a week. 10 tablet 3   docusate sodium (COLACE) 50 MG capsule Take 50 mg by mouth daily.     estradiol  (ESTRACE ) 0.1 MG/GM vaginal cream PLACE 0.5 GRAMS VAGINALLY EVERY EVENING FOR 2 WEEKS, THEN TWICE A WEEK THEREAFTER. 42.5 g 1   ibuprofen  (ADVIL ) 600 MG tablet Take 1 tablet (600 mg total) by mouth every 6 (six) hours as needed. 30 tablet 0   phenylephrine  (SUDAFED PE) 10 MG TABS tablet Take 10 mg by mouth every 4 (four) hours as needed.     amoxicillin -clavulanate (AUGMENTIN ) 875-125 MG tablet Take 1 tablet by mouth 2 (two) times daily. (Patient not taking: Reported on 07/22/2024) 20 tablet 0   hydrocortisone  (ANUSOL -HC) 25 MG suppository Place 1 suppository (25 mg total) rectally 2 (two) times daily. (Patient not taking: Reported on 07/22/2024) 12 suppository 0   No current facility-administered medications for this visit.    Allergies as of 07/22/2024   (No Known Allergies)    Family History  Problem Relation Age of Onset   Breast cancer Mother    Cancer Mother        breast   Diabetes Father    Hypertension Father    Liver disease Father    Breast cancer Maternal Grandmother    Breast cancer Paternal Grandmother    Colon cancer Paternal Grandmother    Colon polyps Neg Hx    Other Neg Hx     Social History   Socioeconomic History   Marital status: Married    Spouse name: Therapist, Nutritional   Number of children: 3   Years of education: Not on file   Highest education level: Associate degree: occupational, scientist, product/process development, or vocational program  Occupational History    Comment: [name]   Occupation: producer, television/film/video  Tobacco Use   Smoking  status: Never   Smokeless tobacco: Never  Vaping Use   Vaping status: Never Used  Substance and Sexual Activity   Alcohol use: No   Drug use: Never   Sexual activity: Yes    Birth control/protection: Surgical  Other Topics Concern   Not on file  Social History Narrative   Lives with family   Social Drivers of Health   Financial Resource Strain: Low Risk  (08/06/2023)   Overall Financial Resource Strain (CARDIA)    Difficulty of Paying Living Expenses: Not hard at all  Food Insecurity: No Food Insecurity (08/06/2023)   Hunger Vital Sign    Worried About Running Out of Food in the Last Year: Never true    Ran Out of Food in the Last Year: Never true  Transportation Needs: No Transportation Needs (08/06/2023)   PRAPARE - Administrator, Civil Service (Medical): No    Lack of Transportation (Non-Medical): No  Physical Activity:  Unknown (08/06/2023)   Exercise Vital Sign    Days of Exercise per Week: 0 days    Minutes of Exercise per Session: Not on file  Stress: Stress Concern Present (08/06/2023)   [employer] of Occupational Health - Occupational Stress Questionnaire    Feeling of Stress : Rather much  Social Connections: Socially Integrated (08/06/2023)   Social Connection and Isolation Panel    Frequency of Communication with Friends and Family: More than three times a week    Frequency of Social Gatherings with Friends and Family: Once a week    Attends Religious Services: More than 4 times per year    Active Member of [employer] or Organizations: Yes    Attends Banker Meetings: 1 to 4 times per year    Marital Status: Married  Catering Manager Violence: Not on file     Review of Systems   Gen: Denies any fever, chills, fatigue, weight loss, lack of appetite.  CV: Denies chest pain, heart palpitations, peripheral edema, syncope.  Resp: Denies shortness of breath at rest or with exertion. Denies wheezing or cough.  GI: Denies dysphagia or  odynophagia. Denies jaundice, hematemesis, fecal incontinence. GU : Denies urinary burning, urinary frequency, urinary hesitancy MS: Denies joint pain, muscle weakness, cramps, or limitation of movement.  Derm: Denies rash, itching, dry skin Psych: Denies depression, anxiety, memory loss, and confusion Heme: Denies bruising, bleeding, and enlarged lymph nodes.   Physical Exam   BP 134/80   Pulse 86   Temp 97.9 F (36.6 C)   Ht 5' 1 (1.549 m)   Wt 123 lb (55.8 kg)   BMI 23.24 kg/m  General:   Alert and oriented. Pleasant and cooperative. Well-nourished and well-developed.  Head:  Normocephalic and atraumatic. Eyes:  Without icterus Abdomen:  +BS, soft, non-tender and non-distended. No HSM noted. No guarding or rebound. No masses appreciated.  Rectal:  small external skin tags, DRE with swollen internal hemorrhoids, no mass, no pain,  Msk:  Symmetrical without gross deformities. Normal posture. Extremities:  Without edema. Neurologic:  Alert and  oriented x4;  grossly normal neurologically. Skin:  Intact without significant lesions or rashes. Psych:  Alert and cooperative. Normal mood and affect.   Assessment/Plan:    Briana Valdez is a 43 y.o. female presenting today with a history of symptomatic hemorrhoids chronically, last colonoscopy in 2022, with worsening prolapsing, pressure, and desires to have hemorrhoid banding.  Anusol  cream BID sent to pharmacy. Continue with avoidance of constipation, straining, and limit toilet time.   Will do anoscopy at next visit. Upcoming visit in near future for banding of grade 2 internal hemorrhoids. No alarm signs/symptoms to warrant colonoscopy at this time.       Therisa MICAEL Stager, PhD, ANP-BC [facility]

## 2024-07-23 ENCOUNTER — Ambulatory Visit: Admitting: Obstetrics and Gynecology

## 2024-07-23 ENCOUNTER — Encounter: Payer: Self-pay | Admitting: Obstetrics and Gynecology

## 2024-07-23 VITALS — BP 123/74 | HR 74

## 2024-07-23 DIAGNOSIS — T8189XD Other complications of procedures, not elsewhere classified, subsequent encounter: Secondary | ICD-10-CM | POA: Diagnosis not present

## 2024-07-23 NOTE — Progress Notes (Signed)
 [facility]  Date of Visit: 07/23/2024  History of Present Illness: Ms. Nagorski is a 43 y.o. female scheduled today for a post-operative visit.   Surgery: s/p revision perineoplasty on 03/24/24  She has been doing well. Using the estrace  cream twice a week and aquaphor other nights. Has not noticed any vaginal bleeding. She has done a few sitz baths. She has been having issues with her hemorrhoids but has seen GI and is planning on having them banded.   Initial surgery: Total vaginal hysterectomy with bilateral salpingectomy, uterosacral ligament suspension, anterior and posterior repair with perineorrhaphy, cystoscopy, urethral bulking ([facility]) on 09/24/23   Medications: She has a current medication list which includes the following prescription(s): acetaminophen , amoxicillin -clavulanate, cabergoline , docusate sodium, estradiol , hydrocortisone , hydrocortisone , ibuprofen , and phenylephrine .   Allergies: Patient has no known allergies.   Physical Exam: BP 123/74   Pulse 74    At introitus, healing well. Loose suture removed, small divot present but overall well healed. No granulation tissue present.    ---------------------------------------------------------  Assessment and Plan:  1. Delayed surgical wound healing, subsequent encounter      - Continue estrace  cream every other day. Use aquaphor on other nights.  - Can try some perineal massage - Reexamine in 2 weeks    Rosaline LOISE Caper, MD  Time spent: I spent 15 minutes dedicated to the care of this patient on the date of this encounter to include pre-visit review of records, face-to-face time with the patient and post visit documentation.

## 2024-08-05 ENCOUNTER — Encounter: Payer: Self-pay | Admitting: Obstetrics and Gynecology

## 2024-08-05 ENCOUNTER — Ambulatory Visit: Admitting: Obstetrics and Gynecology

## 2024-08-05 ENCOUNTER — Other Ambulatory Visit (HOSPITAL_COMMUNITY)
Admission: RE | Admit: 2024-08-05 | Discharge: 2024-08-05 | Disposition: A | Source: Ambulatory Visit | Attending: Obstetrics and Gynecology | Admitting: Obstetrics and Gynecology

## 2024-08-05 VITALS — BP 142/97 | HR 74

## 2024-08-05 DIAGNOSIS — N898 Other specified noninflammatory disorders of vagina: Secondary | ICD-10-CM | POA: Insufficient documentation

## 2024-08-05 NOTE — Progress Notes (Signed)
 [facility]  Date of Visit: 08/05/2024  History of Present Illness: Ms. Worthington is a 43 y.o. female scheduled today for a post-operative visit.   Surgery: s/p revision perineoplasty on 03/24/24  She has been doing well. Alternating the estrace  cream and aquaphor.   Initial surgery: Total vaginal hysterectomy with bilateral salpingectomy, uterosacral ligament suspension, anterior and posterior repair with perineorrhaphy, cystoscopy, urethral bulking ([facility]amid) on 09/24/23   Medications: She has a current medication list which includes the following prescription(s): acetaminophen , amoxicillin -clavulanate, cabergoline , docusate sodium, estradiol , hydrocortisone , hydrocortisone , ibuprofen , and phenylephrine .   Allergies: Patient has no known allergies.   Physical Exam: BP (!) 142/97   Pulse 74    Introitus well healed. No suture present. Prior divot on perineum well healed. No granulation tissue present.   On speculum, no sutures or granulation tissue present but some white discharge present, aptima obtained.   ---------------------------------------------------------  Assessment and Plan:  1. Vaginal discharge     - Ok to resume all activity including intercourse - ordered aptima swab to r/o yeast infection - Will plan for return visit in 6 months or sooner if needed   Rosaline LOISE Caper, MD

## 2024-08-06 ENCOUNTER — Ambulatory Visit: Payer: Self-pay | Admitting: Obstetrics and Gynecology

## 2024-08-06 DIAGNOSIS — N898 Other specified noninflammatory disorders of vagina: Secondary | ICD-10-CM

## 2024-08-06 LAB — CERVICOVAGINAL ANCILLARY ONLY
Bacterial Vaginitis (gardnerella): POSITIVE — AB
Candida Glabrata: NEGATIVE
Candida Vaginitis: NEGATIVE
Comment: NEGATIVE
Comment: NEGATIVE
Comment: NEGATIVE

## 2024-08-06 MED ORDER — METRONIDAZOLE 500 MG PO TABS: 500.0000 mg | ORAL_TABLET | Freq: Two times a day (BID) | ORAL | 0 refills | Status: AC

## 2024-08-06 NOTE — Progress Notes (Signed)
 Attempted to contact patient. Briana Valdez

## 2024-08-07 ENCOUNTER — Ambulatory Visit: Admitting: Gastroenterology

## 2024-08-07 VITALS — BP 125/85 | HR 84 | Temp 98.6°F | Ht 61.0 in | Wt 124.8 lb

## 2024-08-07 DIAGNOSIS — K642 Third degree hemorrhoids: Secondary | ICD-10-CM | POA: Diagnosis not present

## 2024-08-07 NOTE — Progress Notes (Signed)
° ° ° ° °  [facility] BANDING PROCEDURE NOTE  Briana Valdez is a 43 y.o. female presenting today for consideration of hemorrhoid banding. Last colonoscopy 2022 with internal hemorrhoids. She has bleeding, prolapsing, pressure.    The patient presents with symptomatic grade 3 hemorrhoids, unresponsive to maximal medical therapy, requesting rubber band ligation of er hemorrhoidal disease. All risks, benefits, and alternative forms of therapy were described and informed consent was obtained.  Attempted anoscopy but had discomfort. She has a petite frame, and I suspected this may not be ideal. DRE without any mass or concerns.   The decision was made to band the left lateral internal hemorrhoid, and the [facility] O'Regan System was used to perform band ligation without complication. Digital anorectal examination was then performed to assure proper positioning of the band, and to adjust the banded tissue as required. The patient was discharged home without pain or other issues. Dietary and behavioral recommendations were given, along with follow-up instructions. The patient will return in several weeks for followup and possible additional banding as required.  No complications were encountered and the patient tolerated the procedure well.   Therisa MICAEL Stager, PhD, ANP-BC [facility]

## 2024-08-07 NOTE — Patient Instructions (Signed)
 Continue to avoid straining, limit toilet time to 2-3 minutes, and continue the stool softener.   I will see you in a few weeks for further banding!  Please reach out if any concerns!  Have a wonderful Christmas!  I enjoyed seeing you again today! I value our relationship and want to provide genuine, compassionate, and quality care. You may receive a survey regarding your visit with me, and I welcome your feedback! Thanks so much for taking the time to complete this. I look forward to seeing you again.      Therisa MICAEL Stager, PhD, ANP-BC [facility]

## 2024-08-08 NOTE — Telephone Encounter (Signed)
 Personally spoke with patient. Increased rectum pressure but NO pain. She has had the pressure sensation and notes increased anxiety with this, wanting to make sure all is well.   She has slight nausea. Took one flagyl  yesterday evening with small amount of soup (flagyl  for non-GI concern). Has had 2 bowel movements today, no pain. Feels slight abdominal cramping. Nausea main concern.   No fever, chills, difficulty urinating.  Spoke in depth and discussed that although rare, some patients do have pronounced response and vasovagal type response. I discussed with her that anecdotally, I have noted this primarliy in more petite females.   She will alternate tylenol  and ibuprofen . Zofran  prn. I encouraged her to eat a soft diet. Hold off on flagyl  right now until tomorrow or when symptoms have abated. Flagyl  is for non-urgent GYN etiology and we have some time to delay this. Suspect flagyl  could have precipitated nausea as well in light of decreased oral intake.   Symptoms should improve in next 24 hours. She will reach out if any other concerns.

## 2024-08-20 NOTE — Progress Notes (Signed)
 Patient is aware.

## 2024-09-01 ENCOUNTER — Ambulatory Visit: Admitting: Gastroenterology

## 2024-09-01 ENCOUNTER — Encounter: Payer: Self-pay | Admitting: Gastroenterology

## 2024-09-01 VITALS — BP 128/80 | HR 75 | Temp 98.3°F | Ht 61.0 in | Wt 122.0 lb

## 2024-09-01 DIAGNOSIS — K642 Third degree hemorrhoids: Secondary | ICD-10-CM | POA: Diagnosis not present

## 2024-09-01 MED ORDER — ONDANSETRON HCL 4 MG PO TABS: 4.0000 mg | ORAL_TABLET | Freq: Three times a day (TID) | ORAL | 1 refills | Status: AC | PRN

## 2024-09-01 NOTE — Addendum Note (Signed)
 Addended by: SHIRLEAN THERISA ORN on: 09/01/2024 10:59 AM   Modules accepted: Orders

## 2024-09-01 NOTE — Patient Instructions (Signed)
" °  Please avoid straining.  You should limit your toilet time to 2-3 minutes at the most.   I recommend Benefiber 2 teaspoons each morning in the beverage of your choice!  Please call me with any concerns or issues!  I will see you in follow-up for additional banding in several weeks.   Please message me with any concerns! Happy early birthday :)    Therisa MICAEL Stager, PhD, ANP-BC [facility]enter[facility]       "

## 2024-09-01 NOTE — Progress Notes (Signed)
" ° ° °  [facility] BANDING PROCEDURE NOTE  Briana Valdez is a 44 y.o. female presenting today for consideration of hemorrhoid banding. Last colonoscopy 2022 with internal hemorrhoids. She has had left lateral banding thus far.    The patient presents with symptomatic grade 3 hemorrhoids, unresponsive to maximal medical therapy, requesting rubber band ligation of her hemorrhoidal disease. All risks, benefits, and alternative forms of therapy were described and informed consent was obtained.   The decision was made to band the right posterior  internal hemorrhoid, and the [facility] O'Regan System was used to perform band ligation without complication. Digital anorectal examination was then performed to assure proper positioning of the band, and to adjust the banded tissue as required. The patient was discharged home without pain or other issues. Dietary and behavioral recommendations were given, along with follow-up instructions. The patient will return in several weeks for followup and possible additional banding as required.  No complications were encountered and the patient tolerated the procedure well.   Therisa MICAEL Stager, PhD, ANP-BC [facility]enter[facility]   "

## 2024-09-05 ENCOUNTER — Other Ambulatory Visit: Payer: Self-pay | Admitting: Family Medicine

## 2024-09-05 DIAGNOSIS — Z1231 Encounter for screening mammogram for malignant neoplasm of breast: Secondary | ICD-10-CM

## 2024-09-09 ENCOUNTER — Ambulatory Visit: Payer: Self-pay

## 2024-09-09 DIAGNOSIS — N200 Calculus of kidney: Secondary | ICD-10-CM

## 2024-09-09 MED ORDER — POTASSIUM CITRATE ER 15 MEQ (1620 MG) PO TBCR: 1.0000 | EXTENDED_RELEASE_TABLET | Freq: Two times a day (BID) | ORAL | 3 refills | Status: AC

## 2024-09-09 NOTE — Telephone Encounter (Signed)
-----   Message from Belvie Clara, MD sent at 09/09/2024  9:44 AM EST ----- Citrate is low. She should start UrocitK 15meq BID. Please have her get a BMP after being on the meds for 2 weeks

## 2024-09-10 ENCOUNTER — Encounter: Payer: Self-pay | Admitting: Family Medicine

## 2024-09-15 ENCOUNTER — Ambulatory Visit: Payer: BC Managed Care – PPO | Admitting: Internal Medicine

## 2024-09-24 ENCOUNTER — Ambulatory Visit: Admitting: Gastroenterology

## 2024-09-24 ENCOUNTER — Encounter: Payer: Self-pay | Admitting: Gastroenterology

## 2024-09-24 VITALS — BP 138/88 | HR 74 | Temp 97.1°F | Ht 61.0 in | Wt 118.5 lb

## 2024-09-24 DIAGNOSIS — K645 Perianal venous thrombosis: Secondary | ICD-10-CM | POA: Diagnosis not present

## 2024-09-24 NOTE — Patient Instructions (Addendum)
 I have sent the compounded hemorrhoid cream to [facility]. I want you to massage this around your rectum 3 times a day for 3 minutes at a time. They will call you to go over the price and when it is ready!  Take a warm-water  bath (sitz bath) for 20 minutes to ease pain. Do this 3-4 times a day. You may do this in a bathtub. You may also use a portable sitz bath that fits over the toilet.  The clot where the hemorrhoid is should start resorbing and getting better. Please message me if any problems!  I will see you in 3-4 weeks!  I enjoyed seeing you again today! I value our relationship and want to provide genuine, compassionate, and quality care. You may receive a survey regarding your visit with me, and I welcome your feedback! Thanks so much for taking the time to complete this. I look forward to seeing you again.      Therisa MICAEL Stager, PhD, ANP-BC [facility]enterology

## 2024-09-24 NOTE — Progress Notes (Signed)
 "      Gastroenterology Office Note     Primary Care Physician:  Alphonsa Glendia LABOR, MD  Primary Gastroenterologist: Dr Cindie   Chief Complaint   Chief Complaint  Patient presents with   Hemorrhoids    Patient here today to discuss her issues with banding. She says she does not believe she will be able to go through with a banding today. She says she has some thrombosed hemorrhoids currently.      History of Present Illness   Briana Valdez is a 44 y.o. female presenting today with a history of symptomatic hemorrhoids, s/p banding recently of left lateral and right posterior columns. Last colonoscopy 2022 with internal hemorrhoids.   After last banding, she did well until about 5 days later when she was at a women's conference and sat in a chair for 2.5 hours. She rarely sits, as she is a hair stylist and stands all day. She noted lots of pressure, rectal pain, fire, and saw purple spots on outside of rectum. Feels heavy and pressure. Symptoms have improved since onset but still with pressure and intermittent pain. No rectal bleeding.   No straining. BM daily to twice a day. No prolonged toilet time. Feels sore on right anterior part of rectum. Prior labioplasty and has not felt right since this time. Feels like she can't relax her muscles.   Past Medical History:  Diagnosis Date   Hemorrhoids    History of kidney stones    Pituitary microadenoma with hyperprolactinemia [facility]) 2018   endocrinologist--- dr sam; dx 2018;     last MRI 02/ 2024 in epic stable adenoma;  prolactin level normal while taking cabergoline    PONV (postoperative nausea and vomiting)    SUI (stress urinary incontinence, female)    Uterovaginal prolapse, incomplete     Past Surgical History:  Procedure Laterality Date   ANTERIOR AND POSTERIOR REPAIR N/A 09/24/2023   Procedure: ANTERIOR (CYSTOCELE) AND POSTERIOR REPAIR (RECTOCELE) with perineorrhaphy;  Surgeon: Marilynne Rosaline SAILOR, MD;  Location:  [facility] [place];  Service: Gynecology;  Laterality: N/A;   BREAST ENHANCEMENT SURGERY Bilateral 2010   w/ implants   COLONOSCOPY WITH PROPOFOL  N/A 10/18/2020   by dr cindie ;   Non-bleeding internal hemorrhoids, otherwise normal.   CYSTOSCOPY N/A 09/24/2023   Procedure: CYSTOSCOPY;  Surgeon: Marilynne Rosaline SAILOR, MD;  Location: [facility];  Service: Gynecology;  Laterality: N/A;   PERINEOPLASTY N/A 03/24/2024   Procedure: PERINEOPLASTY REVISION;  Surgeon: Marilynne Rosaline SAILOR, MD;  Location: [facility] OR;  Service: Gynecology;  Laterality: N/A;   VAGINAL HYSTERECTOMY N/A 09/24/2023   Procedure: HYSTERECTOMY VAGINAL WITH BILATERAL SALPINGECTOMY;  Surgeon: Marilynne Rosaline SAILOR, MD;  Location: [facility];  Service: Gynecology;  Laterality: N/A;   VAGINAL PROLAPSE REPAIR N/A 09/24/2023   Procedure: UTEROSACRAL LIGAMENT SUSPENSION;  Surgeon: Marilynne Rosaline SAILOR, MD;  Location: [facility];  Service: Gynecology;  Laterality: N/A;    Current Outpatient Medications  Medication Sig Dispense Refill   acetaminophen  (TYLENOL ) 500 MG tablet Take 1 tablet (500 mg total) by mouth every 6 (six) hours as needed (pain). 30 tablet 0   cabergoline  (DOSTINEX ) 0.5 MG tablet Take 0.5 tablets (0.25 mg total) by mouth once a week. 10 tablet 3   docusate sodium (COLACE) 50 MG capsule Take 50 mg by mouth daily.     ibuprofen  (ADVIL ) 600 MG tablet Take 1 tablet (600 mg total) by mouth every 6 (six) hours as needed.  30 tablet 0   ondansetron  (ZOFRAN ) 4 MG tablet Take 1 tablet (4 mg total) by mouth every 8 (eight) hours as needed for nausea or vomiting. 30 tablet 1   hydrocortisone  (ANUSOL -HC) 2.5 % rectal cream Place 1 Application rectally 2 (two) times daily. Pea sized amount inside rectum (Patient not taking: Reported on 09/24/2024) 30 g 1   hydrocortisone  (ANUSOL -HC) 25 MG suppository Place 1 suppository (25 mg total) rectally 2 (two) times daily. (Patient not taking:  Reported on 09/24/2024) 12 suppository 0   phenylephrine  (SUDAFED PE) 10 MG TABS tablet Take 10 mg by mouth every 4 (four) hours as needed. (Patient not taking: Reported on 09/24/2024)     Potassium Citrate  15 MEQ (1620 MG) TBCR Take 1 tablet by mouth 2 (two) times daily. (Patient not taking: Reported on 09/24/2024) 120 tablet 3   No current facility-administered medications for this visit.    Allergies as of 09/24/2024   (No Known Allergies)    Family History  Problem Relation Age of Onset   Breast cancer Mother    Cancer Mother        breast   Diabetes Father    Hypertension Father    Liver disease Father    Breast cancer Maternal Grandmother    Breast cancer Paternal Grandmother    Colon cancer Paternal Grandmother    Colon polyps Neg Hx    Other Neg Hx     Social History   Socioeconomic History   Marital status: Married    Spouse name: Therapist, Nutritional   Number of children: 3   Years of education: Not on file   Highest education level: Associate degree: occupational, scientist, product/process development, or vocational program  Occupational History    Comment: [name]   Occupation: producer, television/film/video  Tobacco Use   Smoking status: Never   Smokeless tobacco: Never  Vaping Use   Vaping status: Never Used  Substance and Sexual Activity   Alcohol use: No   Drug use: Never   Sexual activity: Yes    Birth control/protection: Surgical  Other Topics Concern   Not on file  Social History Narrative   Lives with family   Social Drivers of Health   Tobacco Use: Low Risk (09/24/2024)   Patient History    Smoking Tobacco Use: Never    Smokeless Tobacco Use: Never    Passive Exposure: Not on file  Financial Resource Strain: Low Risk (08/06/2023)   Overall Financial Resource Strain (CARDIA)    Difficulty of Paying Living Expenses: Not hard at all  Food Insecurity: No Food Insecurity (08/06/2023)   Hunger Vital Sign    Worried About Running Out of Food in the Last Year: Never true    Ran Out of Food in the Last Year:  Never true  Transportation Needs: No Transportation Needs (08/06/2023)   PRAPARE - Administrator, Civil Service (Medical): No    Lack of Transportation (Non-Medical): No  Physical Activity: Unknown (08/06/2023)   Exercise Vital Sign    Days of Exercise per Week: 0 days    Minutes of Exercise per Session: Not on file  Stress: Stress Concern Present (08/06/2023)   [employer] of Occupational Health - Occupational Stress Questionnaire    Feeling of Stress : Rather much  Social Connections: Socially Integrated (08/06/2023)   Social Connection and Isolation Panel    Frequency of Communication with Friends and Family: More than three times a week    Frequency of Social Gatherings with Friends and Family:  Once a week    Attends Religious Services: More than 4 times per year    Active Member of Clubs or Organizations: Yes    Attends Banker Meetings: 1 to 4 times per year    Marital Status: Married  Catering Manager Violence: Not on file  Depression (PHQ2-9): Low Risk (02/29/2024)   Depression (PHQ2-9)    PHQ-2 Score: 2  Alcohol Screen: Low Risk (08/06/2023)   Alcohol Screen    Last Alcohol Screening Score (AUDIT): 2  Housing: Low Risk (08/06/2023)   Housing Stability Vital Sign    Unable to Pay for Housing in the Last Year: No    Number of Times Moved in the Last Year: 0    Homeless in the Last Year: No  Utilities: Not on file  Health Literacy: Not on file     Review of Systems   Gen: Denies any fever, chills, fatigue, weight loss, lack of appetite.  CV: Denies chest pain, heart palpitations, peripheral edema, syncope.  Resp: Denies shortness of breath at rest or with exertion. Denies wheezing or cough.  GI: Denies dysphagia or odynophagia. Denies jaundice, hematemesis, fecal incontinence. GU : Denies urinary burning, urinary frequency, urinary hesitancy MS: Denies joint pain, muscle weakness, cramps, or limitation of movement.  Derm: Denies rash,  itching, dry skin Psych: Denies depression, anxiety, memory loss, and confusion Heme: Denies bruising, bleeding, and enlarged lymph nodes.   Physical Exam   BP 138/88 (BP Location: Left Arm, Patient Position: Sitting, Cuff Size: Normal)   Pulse 74   Temp (!) 97.1 F (36.2 C) (Temporal)   Ht 5' 1 (1.549 m)   Wt 118 lb 8 oz (53.8 kg)   BMI 22.39 kg/m  General:   Alert and oriented. Pleasant and cooperative. Well-nourished and well-developed.  Head:  Normocephalic and atraumatic. Rectal: small left lateral pea-sized resolving thrombosed internal hemorrhoid that appears to communicate with external as well. DRE without mass and has palpable left lateral column. No overt bleeding. No obvious fissure.  Msk:  Symmetrical without gross deformities. Normal posture. Extremities:  Without edema. Neurologic:  Alert and  oriented x4;  grossly normal neurologically. Skin:  Intact without significant lesions or rashes. Psych:  Alert and cooperative. Normal mood and affect.   Assessment/Plan:    History of symptomatic hemorrhoids s/p banding of left lateral and right posterior columns previously, now with resolving thrombosed internal hemorrhoid and symptoms improving. I am unable to rule out concomitant fissure in light of her constellation of symptoms.  I have called in [facility] cream compounded with lidocaine  and diltiazem ointment. She will massage this three times a day for 3 minutes at a time until I see her again. Continue with sitz baths, avoidance of straining, avoidance of constipation. Will attempt anoscopy at last visit. If continues to have issues, recommend flex sig.     Therisa MICAEL Stager, PhD, ANP-BC [facility]      "

## 2024-09-25 ENCOUNTER — Other Ambulatory Visit

## 2024-10-13 ENCOUNTER — Other Ambulatory Visit

## 2024-10-20 ENCOUNTER — Ambulatory Visit

## 2024-10-30 ENCOUNTER — Encounter: Admitting: Gastroenterology

## 2024-11-17 ENCOUNTER — Ambulatory Visit: Admitting: Internal Medicine

## 2024-12-08 ENCOUNTER — Encounter: Admitting: Family Medicine

## 2024-12-17 ENCOUNTER — Ambulatory Visit: Admitting: Urology
